# Patient Record
Sex: Female | Born: 1975 | Race: White | Hispanic: Yes | Smoking: Never smoker
Health system: Southern US, Community
[De-identification: ages and names within clinical notes are randomized; demographics above are authoritative.]

## PROBLEM LIST (undated history)

## (undated) DIAGNOSIS — T7840XA Allergy, unspecified, initial encounter: Secondary | ICD-10-CM

## (undated) DIAGNOSIS — C801 Malignant (primary) neoplasm, unspecified: Secondary | ICD-10-CM

## (undated) HISTORY — DX: Allergy, unspecified, initial encounter: T78.40XA

## (undated) HISTORY — PX: MOLE REMOVAL: SHX2046

## (undated) MED FILL — Fosaprepitant Dimeglumine For IV Infusion 150 MG (Base Eq): INTRAVENOUS | Qty: 5 | Status: AC

---

## 1999-01-03 ENCOUNTER — Other Ambulatory Visit: Admission: RE | Admit: 1999-01-03 | Discharge: 1999-01-03 | Payer: Self-pay | Admitting: *Deleted

## 2000-01-22 ENCOUNTER — Other Ambulatory Visit: Admission: RE | Admit: 2000-01-22 | Discharge: 2000-01-22 | Payer: Self-pay | Admitting: Obstetrics & Gynecology

## 2001-04-02 ENCOUNTER — Other Ambulatory Visit: Admission: RE | Admit: 2001-04-02 | Discharge: 2001-04-02 | Payer: Self-pay | Admitting: Obstetrics & Gynecology

## 2002-04-22 ENCOUNTER — Other Ambulatory Visit: Admission: RE | Admit: 2002-04-22 | Discharge: 2002-04-22 | Payer: Self-pay | Admitting: Obstetrics & Gynecology

## 2003-05-09 ENCOUNTER — Other Ambulatory Visit: Admission: RE | Admit: 2003-05-09 | Discharge: 2003-05-09 | Payer: Self-pay | Admitting: Obstetrics & Gynecology

## 2007-05-27 ENCOUNTER — Ambulatory Visit: Payer: Self-pay | Admitting: Internal Medicine

## 2007-05-27 LAB — CONVERTED CEMR LAB
Protein, U semiquant: NEGATIVE
Specific Gravity, Urine: 1.01
Urobilinogen, UA: 0.2

## 2007-05-28 LAB — CONVERTED CEMR LAB
Albumin: 3.9 g/dL (ref 3.5–5.2)
Alkaline Phosphatase: 35 units/L — ABNORMAL LOW (ref 39–117)
BUN: 8 mg/dL (ref 6–23)
Basophils Absolute: 0 10*3/uL (ref 0.0–0.1)
Cholesterol: 181 mg/dL (ref 0–200)
GFR calc Af Amer: 126 mL/min
GFR calc non Af Amer: 104 mL/min
HDL: 82.4 mg/dL (ref >39.0)
Hemoglobin: 13.8 g/dL (ref 12.0–15.0)
LDL Cholesterol: 84 mg/dL (ref 0–99)
Lymphocytes Relative: 36.3 % (ref 12.0–46.0)
MCHC: 33.4 g/dL (ref 30.0–36.0)
Monocytes Absolute: 0.3 10*3/uL (ref 0.2–0.7)
Monocytes Relative: 5.4 % (ref 3.0–11.0)
Neutro Abs: 2.6 10*3/uL (ref 1.4–7.7)
Platelets: 145 10*3/uL — ABNORMAL LOW (ref 150–400)
Potassium: 4.7 meq/L (ref 3.5–5.1)
RDW: 12.4 % (ref 11.5–14.6)
Triglycerides: 74 mg/dL (ref 0–149)

## 2008-09-21 ENCOUNTER — Encounter (INDEPENDENT_AMBULATORY_CARE_PROVIDER_SITE_OTHER): Payer: Self-pay | Admitting: Obstetrics

## 2008-09-21 ENCOUNTER — Inpatient Hospital Stay (HOSPITAL_COMMUNITY): Admission: AD | Admit: 2008-09-21 | Discharge: 2008-09-24 | Payer: Self-pay | Admitting: Obstetrics & Gynecology

## 2008-09-26 ENCOUNTER — Encounter: Admission: RE | Admit: 2008-09-26 | Discharge: 2008-10-25 | Payer: Self-pay | Admitting: Obstetrics & Gynecology

## 2008-10-26 ENCOUNTER — Encounter: Admission: RE | Admit: 2008-10-26 | Discharge: 2008-11-25 | Payer: Self-pay | Admitting: Obstetrics & Gynecology

## 2008-11-26 ENCOUNTER — Encounter: Admission: RE | Admit: 2008-11-26 | Discharge: 2008-12-26 | Payer: Self-pay | Admitting: Obstetrics & Gynecology

## 2008-12-27 ENCOUNTER — Encounter: Admission: RE | Admit: 2008-12-27 | Discharge: 2009-01-26 | Payer: Self-pay | Admitting: Obstetrics & Gynecology

## 2009-01-27 ENCOUNTER — Encounter: Admission: RE | Admit: 2009-01-27 | Discharge: 2009-02-26 | Payer: Self-pay | Admitting: Obstetrics & Gynecology

## 2009-02-27 ENCOUNTER — Encounter: Admission: RE | Admit: 2009-02-27 | Discharge: 2009-03-28 | Payer: Self-pay | Admitting: Obstetrics & Gynecology

## 2009-03-29 ENCOUNTER — Encounter: Admission: RE | Admit: 2009-03-29 | Discharge: 2009-04-26 | Payer: Self-pay | Admitting: Obstetrics & Gynecology

## 2009-04-29 ENCOUNTER — Encounter: Admission: RE | Admit: 2009-04-29 | Discharge: 2009-05-29 | Payer: Self-pay | Admitting: Obstetrics & Gynecology

## 2009-05-30 ENCOUNTER — Encounter: Admission: RE | Admit: 2009-05-30 | Discharge: 2009-06-26 | Payer: Self-pay | Admitting: Obstetrics & Gynecology

## 2009-06-27 ENCOUNTER — Encounter: Admission: RE | Admit: 2009-06-27 | Discharge: 2009-07-27 | Payer: Self-pay | Admitting: Obstetrics & Gynecology

## 2009-07-13 ENCOUNTER — Ambulatory Visit: Payer: Self-pay | Admitting: Internal Medicine

## 2009-07-28 ENCOUNTER — Encounter: Admission: RE | Admit: 2009-07-28 | Discharge: 2009-08-27 | Payer: Self-pay | Admitting: Obstetrics & Gynecology

## 2009-08-28 ENCOUNTER — Encounter: Admission: RE | Admit: 2009-08-28 | Discharge: 2009-09-27 | Payer: Self-pay | Admitting: Obstetrics & Gynecology

## 2009-09-14 ENCOUNTER — Telehealth (INDEPENDENT_AMBULATORY_CARE_PROVIDER_SITE_OTHER): Payer: Self-pay | Admitting: *Deleted

## 2009-09-28 ENCOUNTER — Encounter: Admission: RE | Admit: 2009-09-28 | Discharge: 2009-10-28 | Payer: Self-pay | Admitting: Obstetrics & Gynecology

## 2009-10-04 ENCOUNTER — Ambulatory Visit: Payer: Self-pay | Admitting: Internal Medicine

## 2009-10-29 ENCOUNTER — Encounter: Admission: RE | Admit: 2009-10-29 | Discharge: 2009-11-28 | Payer: Self-pay | Admitting: Obstetrics & Gynecology

## 2010-03-15 ENCOUNTER — Ambulatory Visit: Payer: Self-pay | Admitting: Internal Medicine

## 2010-03-15 LAB — CONVERTED CEMR LAB
AST: 16 units/L (ref 0–37)
BUN: 14 mg/dL (ref 6–23)
Basophils Absolute: 0 10*3/uL (ref 0.0–0.1)
Bilirubin Urine: NEGATIVE
Blood in Urine, dipstick: NEGATIVE
Cholesterol: 185 mg/dL (ref 0–200)
Eosinophils Absolute: 0.1 10*3/uL (ref 0.0–0.7)
GFR calc non Af Amer: 91.55 mL/min (ref >60)
Glucose, Bld: 79 mg/dL (ref 70–99)
Glucose, Urine, Semiquant: NEGATIVE
HCT: 40.8 % (ref 36.0–46.0)
HDL: 60.6 mg/dL (ref >39.00)
Ketones, urine, test strip: NEGATIVE
Lymphs Abs: 1.4 10*3/uL (ref 0.7–4.0)
Monocytes Absolute: 0.3 10*3/uL (ref 0.1–1.0)
Monocytes Relative: 5.5 % (ref 3.0–12.0)
Platelets: 162 10*3/uL (ref 150.0–400.0)
Potassium: 4 meq/L (ref 3.5–5.1)
Protein, U semiquant: NEGATIVE
RDW: 13.6 % (ref 11.5–14.6)
TSH: 1.15 microintl units/mL (ref 0.35–5.50)
Total Bilirubin: 0.9 mg/dL (ref 0.3–1.2)
VLDL: 13 mg/dL (ref 0.0–40.0)
pH: 5

## 2010-03-28 LAB — CONVERTED CEMR LAB: Pap Smear: NORMAL

## 2010-04-08 ENCOUNTER — Ambulatory Visit: Payer: Self-pay | Admitting: Internal Medicine

## 2010-05-28 NOTE — Assessment & Plan Note (Signed)
Summary: SINUS & EAR/PS   Vital Signs:  Patient profile:   35 year old female Weight:      114 pounds Temp:     98 degrees F oral Pulse rhythm:   regular Resp:     12 per minute BP sitting:   102 / 66  Vitals Entered By: Lynann Beaver CMA (July 13, 2009 2:08 PM) CC: sore throat, congestion and dry cough.  No fever Is Patient Diabetic? No Pain Assessment Patient in pain? no        History of Present Illness: [name]  comes in today for   acute illness evaluation.  CO of signs for 3-4 days  with sore throat achiness and congestion and when blew ears and popped and then got dizzy.   No fever.  Very hoarse.   No cough or sob. Did take some sudafed and nyquil at some point.  non longer dizzy . no other  heuro symptom and no pain except ST currently.  No hearing loss and dizzines isbetter  . Is still nursing her infant who is in day care and just treated for OM.      Preventive Screening-Counseling & Management  Alcohol-Tobacco     Smoking Status: never     Pack years: socially only  Caffeine-Diet-Exercise     Does Patient Exercise: yes  Allergies: No Known Drug Allergies  Past History:  Past Medical History: Unremarkable Childbirth ? allergy  Review of Systems       The patient complains of hoarseness.  The patient denies anorexia, fever, weight loss, weight gain, vision loss, decreased hearing, chest pain, syncope, dyspnea on exertion, peripheral edema, prolonged cough, headaches, difficulty walking, abnormal bleeding, and enlarged lymph nodes.    Physical Exam  General:  alert, well-developed, well-nourished, and well-hydrated.  hoarse in nad  Head:  normocephalic and atraumatic.   Eyes:  vision grossly intact, pupils equal, and pupils round.   Ears:  R ear normal, L ear normal, and no external deformities.   Nose:  no external deformity and no external erythema.  congested  no face pain  Mouth:  mild erythema  no lesions  cobblestoning  Neck:  No  deformities, masses, or tenderness noted. Lungs:  Normal respiratory effort, chest expands symmetrically. Lungs are clear to auscultation, no crackles or wheezes. Heart:  normal rate, regular rhythm, and no murmur.   Pulses:  nl cap refill  Neurologic:  alert & oriented X3, strength normal in all extremities, and gait normal.  no n focal grossly  Cervical Nodes:  tender ac not enlarged no posterior cervical adenopathy.   Psych:  Oriented X3, good eye contact, not anxious appearing, and not depressed appearing.     Impression & Recommendations:  Problem # 1:  URI (ICD-465.9) with eustachan tube dysfunction  causing symptom .Marland Kitchen no evidence of inner ear or bacterial infection at present.  limited  orals cause of nursing infant    Problem # 2:  LARYNGITIS-ACUTE (ICD-464.00) as above   Complete Medication List: 1)  Yaz 3-0.02 Mg Tabs (Drospirenone-ethinyl estradiol) .... Once daily 2)  Sudafed 12 Hour 120 Mg Xr12h-tab (Pseudoephedrine hcl) .... As directed 3)  Pre-natal Formula Tabs (Prenatal multivit-min-fe-fa) .... One by mouth daily  Patient Instructions: 1)  I think this is a viral respiratory  infection that may last 10 days but should feel better after another  4-5 days.  2)  Nasal decongestants  ok for no more than 3 days in a row  to help with th sinus and ear congestion. Saline  nose spray can help nose irritation.   3)  Claritin  is ok if needed.  4)  call if fever or face pain   or recurrent dizziness .  5)  Acute sinusitis symptoms for less than 10 days are not helped by antibiotics. Use warm moist compresses, and over the counter decongestants( only as directed). Call if no improvement in 5-7 days, sooner if increasing pain, fever, or new symptoms.

## 2010-05-28 NOTE — Assessment & Plan Note (Signed)
Summary: poison ivy/dm   Vital Signs:  Patient profile:   35 year old female Height:      69 inches Weight:      124 pounds BMI:     18.38 Temp:     98.1 degrees F oral Pulse rate:   88 / minute BP sitting:   110 / 70  (left arm) Cuff size:   regular  Vitals Entered By: Romualdo Bolk, CMA (AAMA) (October 04, 2009 10:23 AM) CC: Poison Ivy on Rt arm only. Started with one dot 2 weeks ago.   History of Present Illness: [name] comes in today  for SDA for a week of itchy rash  after exposure to PI pulling vinse  began as a dot 2 weeks ago and blossomed this week and spreading .   very itvchy and used otc hcs and urisol prep. no help . scalp itches at base from dry scalp but no rash there.  no one else in family  effected.   Preventive Screening-Counseling & Management  Alcohol-Tobacco     Smoking Status: never     Pack years: socially only  Caffeine-Diet-Exercise     Does Patient Exercise: yes  Current Medications (verified): 1)  Yaz 3-0.02 Mg  Tabs (Drospirenone-Ethinyl Estradiol) .... Once Daily 2)  Pre-Natal Formula  Tabs (Prenatal Multivit-Min-Fe-Fa) .... One By Mouth Daily  Allergies (verified): No Known Drug Allergies  Past History:  Past medical, surgical, family and social histories (including risk factors) reviewed for relevance to current acute and chronic problems.  Past Medical History: Reviewed history from 07/13/2009 and no changes required. Unremarkable Childbirth ? allergy  Past Surgical History: Reviewed history from 05/27/2007 and no changes required. mole removal from back, was ok  Family History: Reviewed history from 05/27/2007 and no changes required. father and mother A&W  Social History: Reviewed history from 05/27/2007 and no changes required. 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   Review of Systems  The patient denies anorexia,  fever, enlarged lymph nodes, and angioedema.    Physical Exam  General:  Well-developed,well-nourished,in no acute distress; alert,appropriate and cooperative throughout examination Head:  normocephalic and atraumatic.   Skin:  right arm with  linear contact dermatitis  blebs  upper and lower     none on scalp  where co of itching  Cervical Nodes:  No lymphadenopathy noted   Impression & Recommendations:  Problem # 1:  CONTACT DERMATITIS, RHUS (ICD-692.6) counseled     rx as directed and prevention Her updated medication list for this problem includes:    Prednisone 20 Mg Tabs (Prednisone) .Marland Kitchen... Take 3 a day x 3 days then 2 a day x3 days then 1/day x 3 days, then 1/2 a day x 3 days or as directed.    Fluocinonide 0.05 % Crea (Fluocinonide) .Marland Kitchen... Apply to poison ivy rash two times a day  not on face  Complete Medication List: 1)  Yaz 3-0.02 Mg Tabs (Drospirenone-ethinyl estradiol) .... Once daily 2)  Pre-natal Formula Tabs (Prenatal multivit-min-fe-fa) .... One by mouth daily 3)  Prednisone 20 Mg Tabs (Prednisone) .... Take 3 a day x 3 days then 2 a day x3 days then 1/day x 3 days, then 1/2 a day x 3 days or as directed. 4)  Fluocinonide 0.05 % Crea (Fluocinonide) .... Apply to poison ivy rash two times a day  not on face  Patient Instructions: 1)  prednisone   taper can  taper every 2 day sif responding quickly 2)  topical is more effective  if used early. 3)  avoidance in future  Prescriptions: FLUOCINONIDE 0.05 % CREA (FLUOCINONIDE) apply to poison ivy rash two times a day  not on face  #30 gm x 2   Entered and Authorized by:   Madelin Headings MD   Signed by:   Madelin Headings MD on 10/04/2009   Method used:   Electronically to        [facility] [phone]* (retail)       [address]       [place], [place]  [address]       Ph: [phone] or [phone]       Fax: [phone]   RxID:   [id number] PREDNISONE 20 MG TABS (PREDNISONE) Take 3 a day x 3 days then 2 a day x3 days  then 1/day x 3 days, then 1/2 a day x 3 days or as directed.  #30 x 0   Entered and Authorized by:   Madelin Headings MD   Signed by:   Madelin Headings MD on 10/04/2009   Method used:   Electronically to        [facility] [phone][facility] (retail)       [address]       [place], [place]  [address]       Ph: [phone] or [phone]       Fax: [phone]   RxID:   [id number]

## 2010-05-28 NOTE — Progress Notes (Signed)
Summary: Call-A-Nurse Report    Call-A-Nurse Triage Call Report Triage Record Num: [id number] Operator: Dayton Martes Patient Name: [name] Call Date & Time: 09/13/2009 7:42:36PM Patient Phone: PCP: Neta Mends. Panosh Patient Gender: Female PCP Fax : [phone] Patient DOB: 09-04-1975 Practice Name: [facility] Reason for Call: Pt sts that she has a hard, red area on the R breast and she is achy. Denies 911 sx's. Care adv given. Denies urgent emergent sx's. Care adv given. [facility]ACY[facility] @ [phone]. PER ROUTINE STANDING ORDERS-Dicloxacillin 500mg  PO Q 6hrs x 10days; no refills. Protocol(s) Used: Breast Symptoms - Postpartum Recommended Outcome per Protocol: See Provider within 4 hours Reason for Outcome: New onset or increasing redness, tenderness, localized warmth or swelling Care Advice:  ~ Apply warm soaks to the affected breast(s) for 20 to 30 minutes 3 or 4 times a day.  ~ Call provider if symptoms worsen or new symptoms develop.  ~ List, or take, all current prescription(s), OTC or alternative medication(s) to provider for evaluation. If breastfeeding, be sure to continue. Make sure to empty breast with each feeding. If unable to empty breast with nursing, express milk manually or with pump until breast empty.  ~ Drink 6-10 eight ounce glasses (1.2-2.0 liters) of fluids per day unless previously instructed to restrict fluid intake for other medical reasons. Limit fluids that contain caffeine, sugar or alcohol.  ~ Consider acetaminophen as directed on label or by pharmacist/provider for pain or fever. PRECAUTIONS: - Use only If there is no history of liver disease, alcoholism, or intake of three or more alcohol drinks per day. - If approved by provider when breastfeeding. - Do not exceed recommended dose or frequency.  ~  ~ SYMPTOM / CONDITION MANAGEMENT  ~ CAUTIONS 09/13/2009 7:56:56PM Page 1 of 1 [email or web address]   Left message to call back. Just checking  to see how she is doing. Romualdo Bolk, CMA [facility])  Sep 14, 2009 5:08 PM  Left message to call back to see how she is doing. Romualdo Bolk, CMA (AAMA)  Sep 18, 2009 9:36 AM  Appended Document: Call-A-Nurse Report Left message to call back with how she's doing.  Appended Document: Call-A-Nurse Report Pt is doing fine. Will call back if she has anymore problems.

## 2010-05-30 NOTE — Assessment & Plan Note (Signed)
Summary: cpx/no pap/njr----PT [facility] // RS   Vital Signs:  Patient profile:   35 year old female Height:      68.75 inches Weight:      123 pounds Temp:     98.7 degrees F oral Pulse rate:   72 / minute Pulse rhythm:   regular BP sitting:   110 / 74  (left arm) Cuff size:   regular  Vitals Entered By: Alfred Levins, CMA (April 08, 2010 10:17 AM) CC: cpx, no pap   CC:  cpx and no pap.  Current Medications (verified): 1)  Yaz 3-0.02 Mg  Tabs (Drospirenone-Ethinyl Estradiol) .... 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  on child---healthy  Physical Exam  General:  well-developed well-nourished female in no acute distress. She is quite thin. HEENT exam atraumatic, normocephalic, estrogen muscles are intact. Neck is supple without jugular venous distention. Chest clear auscultation cardiac exam S1-S2 are regular. Abdominal exam thin, to bowel sounds, soft and nontender. Extremities there is no clubbing cyanosis or edema.   Impression & Recommendations:  Problem # 1:  PREVENTIVE HEALTH CARE (ICD-V70.0) health maint UTD  continue exercise- increase calories.   Complete Medication List: 1)  Yaz 3-0.02 Mg Tabs (Drospirenone-ethinyl estradiol) .... Once daily  Preventive Care Screening  Pap Smear:    Date:  03/28/2010    Next Due:  03/2011    Results:  normal     Orders Added: 1)  Est. Patient 18-39 years [99395]

## 2010-06-29 ENCOUNTER — Ambulatory Visit (INDEPENDENT_AMBULATORY_CARE_PROVIDER_SITE_OTHER): Payer: BC Managed Care – PPO | Admitting: Family Medicine

## 2010-06-29 ENCOUNTER — Encounter: Payer: Self-pay | Admitting: Family Medicine

## 2010-06-29 DIAGNOSIS — J029 Acute pharyngitis, unspecified: Secondary | ICD-10-CM | POA: Insufficient documentation

## 2010-07-01 ENCOUNTER — Telehealth: Payer: Self-pay | Admitting: *Deleted

## 2010-07-01 NOTE — Telephone Encounter (Signed)
Needs labs from Saturday clinic, and wants to talk to Dr Cato Mulligan or his nurse about hand, foot and mouth disease that her daughter has, and she is sure she has this.

## 2010-07-02 ENCOUNTER — Encounter: Payer: Self-pay | Admitting: Internal Medicine

## 2010-07-02 ENCOUNTER — Telehealth: Payer: Self-pay | Admitting: *Deleted

## 2010-07-02 ENCOUNTER — Ambulatory Visit (INDEPENDENT_AMBULATORY_CARE_PROVIDER_SITE_OTHER): Payer: BC Managed Care – PPO | Admitting: Internal Medicine

## 2010-07-02 VITALS — BP 102/70 | HR 76 | Temp 98.6°F | Ht 69.0 in | Wt 122.0 lb

## 2010-07-02 DIAGNOSIS — B9789 Other viral agents as the cause of diseases classified elsewhere: Secondary | ICD-10-CM

## 2010-07-02 DIAGNOSIS — B084 Enteroviral vesicular stomatitis with exanthem: Secondary | ICD-10-CM

## 2010-07-02 NOTE — Telephone Encounter (Signed)
Pt made appt today.

## 2010-07-02 NOTE — Telephone Encounter (Signed)
Call-A-Nurse Triage Call Report Triage Record Num: [id number] Operator: Migdalia Dk Patient Name: [name] Call Date & Time: 07/02/2010 2:18:13AM Patient Phone: [phone] PCP: Valetta Mole. Swords Patient Gender: Female PCP Fax : [phone] Patient DOB: 12-Apr-1976 Practice Name: [facility] Reason for Call: Pt. states child just over Hand/Foot/Mouth and this a.m. she woke with blisters on fingers and feet. States feet burn and are swollen, achy. Afebrile at this time. Pt. states she did call office on 07/01/2010 x 3 and received no callback. Homecare advice given and pt. to call office in a.m. for appt. Hand, Foot, Mouth Disease, Diagnosed or Suspected Exposure Protocol(s) Used: Recommended Outcome per Protocol: See Provider within 24 hours Reason for Outcome: New signs and symptoms of local infection Care Advice: ~ Call provider if symptoms worsen or new symptoms develop. Analgesic/Antipyretic Advice - Acetaminophen: Consider acetaminophen as directed on label or by pharmacist/provider for pain or fever PRECAUTIONS: - Use if there is no history of liver disease, alcoholism, or intake of three or more alcohol drinks per day - Only if approved by provider during pregnancy or when breastfeeding - During pregnancy, acetaminophen should not be taken more than 3 consecutive days without telling provider - Do not exceed recommended dose or frequency ~ Apply local moist heat (such as a warm, wet wash cloth covered with plastic wrap) to the area for 15-20 minutes every 2-3 hours while awake. ~ 07/02/2010 2:23:58AM Page 1 of 1 CAN_TriageR

## 2010-07-02 NOTE — Progress Notes (Signed)
  Subjective:    Patient ID: [name], female    DOB: 1975/09/05, 35 y.o.   MRN: [id number]  HPI 3 days ago had acute illness with fever, chills, sweats. sxs were acute.   she reports an interesting contact with her daughter. Her daughter apparently has hand-foot-and-mouth syndrome.   Patient's fever was up to 102. She also had a significant sore throat. His rapid strep was negative. Since that time she has noted painful, burning vesicles on her hands. She has the same sensation on her feet but without any specific vesicles. She still has a mild sore throat. Fevers are somewhat better last documented fever was 101 yesterday. She is otherwise healthy.  Past Medical History  Diagnosis Date  . Allergy    Past Surgical History  Procedure Date  . Mole removal     reports that she has never smoked. She does not have any smokeless tobacco history on file. She reports that she drinks alcohol. She reports that she does not use illicit drugs. family history is not on file.    No Known Allergies    Review of Systems   patient denies chest pain, shortness of breath, orthopnea. Denies lower extremity edema, abdominal pain, change in appetite, change in bowel movements. Patient denies rashes, musculoskeletal complaints. No other specific complaints in a complete review of systems.       Objective:   Physical Exam  Well-developed, thin female in no acute distress. HEENT exam atraumatic, normocephalic, neck supple. Posterior oropharynx, soft palate with 2 abscess appearing ulcers. Tonsils are normal. No exudate. Neck without lymphadenopathy. Chest clear to auscultation cardiac exam S1 and S2 are regular. Dermatologic exam she has erythematous vesicles in the intertriginous regions of her fingers. These are somewhat tender to touch.       Assessment & Plan:   she has a viral illness consistent with  Hand foot and mouth syndrome. This is an unlikely diagnosis in adults but as possible.  She certainly has vesicles on her hands. She is consistent findings on her mouth and describes a viral type illness. This was discussed with her in detail. The diagnosis is even more likely given the diagnosis of her daughter. No specific treatment necessary. I've advised her not to work for 24 hours after her last documented fever. She will call with any concerns.

## 2010-07-04 NOTE — Assessment & Plan Note (Signed)
Summary: Fever, body aches, nausea, headache   Vital Signs:  Patient profile:   35 year old female Height:      68.75 inches Weight:      123 pounds BMI:     18.36 O2 Sat:      97 % on Room air Temp:     99.9 degrees F oral Pulse rate:   119 / minute BP sitting:   112 / 58  (left arm) Cuff size:   regular  Vitals Entered By: Payton Spark CMA (June 29, 2010 9:28 AM)  O2 Flow:  Room air CC: ST, bodyaches, fever, hot/cold flashes, HA x 2 days   Primary Care Provider:  Birdie Sons MD  CC:  ST, bodyaches, fever, hot/cold flashes, and HA x 2 days.  History of Present Illness: ST, bodyaches, fever, hot/cold flashes, HA x 2 days.  Started last evening.  Started with chllls and then achy and then nauseated.  Daughte has hand, foot, mouth right now.  Then had a severe HA and pressure behind her eyes.   Fever to 102.  Lat night took tylenol, nothing this Am.  Really thirsty this AM.  Started dry heaving this AM.  No urinary sxs.   Current Medications (verified): 1)  Yaz 3-0.02 Mg  Tabs (Drospirenone-Ethinyl Estradiol) .... Once Daily  Allergies (verified): No Known Drug Allergies  Physical Exam  General:  Well-developed,well-nourished,in no acute distress; alert,appropriate and cooperative throughout examination Head:  Normocephalic and atraumatic without obvious abnormalities. No apparent alopecia or balding. Eyes:  No corneal or conjunctival inflammation noted. EOMI. Perrla.  Ears:  External ear exam shows no significant lesions or deformities.  Otoscopic examination reveals clear canals, tympanic membranes are intact bilaterally without bulging, retraction, inflammation or discharge. Hearing is grossly normal bilaterally. Nose:  External nasal examination shows no deformity or inflammation. Mouth:  Oral mucosa and oropharynx without lesions or exudates.  OP is erythematous.  Neck:  No deformities, masses, or tenderness noted. Lungs:  Normal respiratory effort, chest expands  symmetrically. Lungs are clear to auscultation, no crackles or wheezes. Heart:  Normal rate and regular rhythm. S1 and S2 normal without gallop, murmur, click, rub or other extra sounds. Pulses:  Radial 2+  Skin:  no rashes.   Cervical Nodes:  Mild anterior cerv LN that is tender on exam.  Psych:  Cognition and judgment appear intact. Alert and cooperative with normal attention span and concentration. No apparent delusions, illusions, hallucinations   Impression & Recommendations:  Problem # 1:  PHARYNGITIS (ICD-462)  Her sxs sound like the flu but she has not cough or runny nose. She does have a ST that is erythematous. Rapid strep is neg. Will send a culture to confirm.  She reports fever to 102 at home whcih is high for strep throt in an adult so will check CBC as well.   Orders: Rapid Strep (16109) T-Culture, Throat ([id number]) T-CBC w/Diff ([id number])  Complete Medication List: 1)  Yaz 3-0.02 Mg Tabs (Drospirenone-ethinyl estradiol) .... Once daily  Patient Instructions: 1)  We will call you with with the strep culture results and blood test 2)  Motrin or Tylenol as needed for fever 3)  Stay hydrated with the fevers.    Orders Added: 1)  Rapid Strep [[id number]] 2)  T-Culture, Throat [[id number]] 3)  T-CBC w/Diff [[id number]]

## 2010-08-06 LAB — CBC
HCT: 29.6 % — ABNORMAL LOW (ref 36.0–46.0)
HCT: 30.2 % — ABNORMAL LOW (ref 36.0–46.0)
Hemoglobin: 10.7 g/dL — ABNORMAL LOW (ref 12.0–15.0)
Hemoglobin: 13.9 g/dL (ref 12.0–15.0)
MCHC: 35.8 g/dL (ref 30.0–36.0)
MCV: 94.7 fL (ref 78.0–100.0)
Platelets: 86 10*3/uL — ABNORMAL LOW (ref 150–400)
Platelets: 99 10*3/uL — ABNORMAL LOW (ref 150–400)
RBC: 4.16 MIL/uL (ref 3.87–5.11)
RDW: 13.4 % (ref 11.5–15.5)
RDW: 13.7 % (ref 11.5–15.5)
WBC: 7.4 10*3/uL (ref 4.0–10.5)
WBC: 7.7 10*3/uL (ref 4.0–10.5)
WBC: 8.4 10*3/uL (ref 4.0–10.5)

## 2010-08-06 LAB — COMPREHENSIVE METABOLIC PANEL
ALT: 19 U/L (ref 0–35)
ALT: 20 U/L (ref 0–35)
AST: 30 U/L (ref 0–37)
Albumin: 2.6 g/dL — ABNORMAL LOW (ref 3.5–5.2)
Alkaline Phosphatase: 148 U/L — ABNORMAL HIGH (ref 39–117)
Alkaline Phosphatase: 99 U/L (ref 39–117)
BUN: 7 mg/dL (ref 6–23)
CO2: 22 mEq/L (ref 19–32)
Chloride: 107 mEq/L (ref 96–112)
GFR calc non Af Amer: >60 mL/min (ref >60)
Glucose, Bld: 71 mg/dL (ref 70–99)
Glucose, Bld: 74 mg/dL (ref 70–99)
Potassium: 3.9 mEq/L (ref 3.5–5.1)
Potassium: 4 mEq/L (ref 3.5–5.1)
Sodium: 138 mEq/L (ref 135–145)
Sodium: 139 mEq/L (ref 135–145)
Total Bilirubin: 0.1 mg/dL — ABNORMAL LOW (ref 0.3–1.2)
Total Protein: 5.7 g/dL — ABNORMAL LOW (ref 6.0–8.3)

## 2010-08-06 LAB — LACTATE DEHYDROGENASE
LDH: 131 U/L (ref 94–250)
LDH: 190 U/L (ref 94–250)

## 2010-08-06 LAB — URIC ACID: Uric Acid, Serum: 5.5 mg/dL (ref 2.4–7.0)

## 2010-09-09 ENCOUNTER — Telehealth: Payer: Self-pay | Admitting: *Deleted

## 2010-09-09 NOTE — Telephone Encounter (Signed)
Romualdo Bolk 07/01/2010 8:57 AM Signed  [facility]  Triage Call Report  Triage Record Num: [id number] Operator: Alphonsa Overall  Patient Name: [name] Call Date & Time: 06/28/2010 6:33:09PM  Patient Phone: [phone] PCP: Valetta Mole. Swords  Patient Gender: Female PCP Fax : [phone]  Patient DOB: 1976-03-30 Practice Name: [facility]  Reason for Call:  LMP 2/12. Chad/ Spouse calling about flu. Onset 06/28/10 at 1600. Headache, muscle aches,  fever,cough. 101.9 oral at 1730. Ibuprofen taken at 1810. Pt did receive flu vaccine. Pt  otherwise healthy. Home care advice given.  Protocol(s) Used: Flu-Like Symptoms  Recommended Outcome per Protocol: Provide Home/Self Care  Reason for Outcome: Flu-like illness AND not in a high risk group  Care Advice:  ~ Use a cool mist humidifier to moisten air. Be sure to clean according to manufacturer's instructions.  ~ Rest until symptoms improve.  ~ Consider use of a saline nasal spray per package directions to help relieve nasal congestion.  ~ INFECTION CONTROL  Call provider immediately if develop a severe productive cough, fever over 101.5 F (38.6 C), and sharp pain when  coughing.  ~  Coughing up mucus or phlegm helps to get rid of an infection. A productive cough should not be stopped. A cough  medicine with guaifenesin (Robitussin, Mucinex) can help loosen the mucus. Cough medicine with dextromethorphan  (DM) should be avoided. Drinking lots of fluids can help loosen the mucus too, especially warm fluids.  ~  Most adults need to drink 6-10 eight-ounce glasses (1.2-2.0 liters) of fluids per day unless previously told to limit fluid  intake for other medical reasons. Limit fluids that contain caffeine, sugar or alcohol. Urine will be a very light yellow  color when you drink enough fluids.  ~  Antiviral medication can make the flu symptoms milder and shorten the time you have symptoms. Flu antiviral  medications are recommended  for individuals who have a greater chance of serious flu complications or who are very  sick with the flu. They work best if they are started within 2 days of getting sick.  ~  Analgesic/Antipyretic Advice - NSAIDs:  Consider aspirin, ibuprofen, naproxen or ketoprofen for pain or fever as directed on label or by pharmacist/provider.  PRECAUTIONS:  - If over 16 years of age, should not take longer than 1 week without consulting provider.  EXCEPTIONS:  - Should not be used if taking blood thinners or have bleeding problems.  - Do not use if have history of sensitivity/allergy to any of these medications; or history of cardiovascular, ulcer,  kidney, liver disease or diabetes unless approved by provider.  - Do not exceed recommended dose or frequency.  ~  See a provider immediately or go to the Emergency Department if having chest pain with breathing, change in level of  consciousness, new seizure, vomiting and unable to keep fluids down for 8 hours or more, or has not urinated for 8 or  more hours.  ~  Influenza - Expected Course:  - Symptoms start to improve in 3 to 7 days.  - Cough and feeling tired (malaise) may continue for several weeks.  - Cough and extreme fatigue lasting more than 3 weeks needs medical evaluation.  - Remain at home at least 24 hours after being free of fever 100F (37.8C) without the use of fever reducing  medication.  ~  Influenza - Transmission:  - Flu virus is spread through the air in droplets when a flu-infected  person coughs, sneezes or talks and another person  breathes in the droplets, or by touching a surface like a door knob, telephone, or keyboard that has been contaminated  ~  06/28/2010 6:52:42PM Page 1 of 2 [email or web address]  [facility]  Triage Call Report  Patient Name: [name] continuation page/s  by the droplets and then touching the mouth, nose, or eyes.  - An individual may be passing on the flu before they have any symptoms.  -  An individual may continue to be contagious with the flu for up to 7 days after the symptoms start. H1N1 influenza  may continue to be contagious as long as cough persists.  Influenza Respiratory Hygiene:  Cover the nose/mouth tightly with a tissue when coughing or sneezing.  - Use tissue one time and discard in the nearest waste receptacle.  - Wash hands with soap and water or alcohol-based hand rub for at least 15 seconds after coming into contact with  respiratory secretions and contaminated objects/materials.  - When a tissue is not available, cough into the bend of the elbow.  - Avoid touching your eyes, nose or mouth.  - When ill wear a disposable face mask when around others, especially around those at high risk for flu-related  complications, to help decrease the likelihood of infecting them.  ~

## 2010-09-10 NOTE — Discharge Summary (Signed)
NAMEJIMMI, [name]          ACCOUNT NO.:  [id number]   MEDICAL RECORD NO.:  [id number]          PATIENT TYPE:  INP   LOCATION:  9117                          FACILITY:  [facility]   PHYSICIAN:  Lendon Colonel, MD   DATE OF BIRTH:  06/05/1975   DATE OF ADMISSION:  09/21/2008  DATE OF DISCHARGE:  09/24/2008                               DISCHARGE SUMMARY   CHIEF COMPLAINT:  Labor.   HISTORY OF PRESENT ILLNESS:  A 35 year old, G1 presenting at 38 weeks  and 3 days gestation with labor with rupture of membranes.  Her prenatal  care history as well as her past medical, surgical, and obstetrical  histories can be found in her OB flow sheet.  On admission, the patient  was found to be 8 cm dilated, 100% effaced with ruptured membranes in  the frank breech position.  Heart tracing was reactive and the patient  was contracting every 2-4 minutes.  Bedside sonogram was done and  consistent with breech presentation.   Plan was for a urgent primary low transverse cesarean section that  operative note can be found in a separate dictation in short.  A 5 pound  14 ounce female with complete breech presentation was noted.  Normal  uterus, tubes, and ovaries were noted.  Two grams of Ancef were given.  Postoperatively, the patient had her pain well controlled with Percocet.  She was ambulating and voiding.  She did have some thrombocytopenia on  postop day #1 with platelets of 86.  These rebound and slightly prior to  discharge up to 92.  On postop day #2, the patient did have some  headaches given the thrombocytopenia with a headaches.  PIH labs were  obtained, however, her blood pressures were within normal limits.  His  labs returned as normal and by postop day #3, her headache was improved.  She was ambulating, voiding, her pain was well controlled.  She was  passing gas, eating.  Her vitals were stable.  Her incision was intact  and the patient was discharged to home.   DISCHARGE DIAGNOSIS:   Status post primary low transverse cesarean  section for breech presentation.   DISCHARGE DISPOSITION:  To home.   DISCHARGE CONDITION:  Stable.   Discharge medications include Motrin, Percocet, Colace.  The patient was  asked to followup in the office in 6 weeks.      Lendon Colonel, MD  Electronically Signed     KAF/MEDQ  D:  10/11/2008  T:  10/12/2008  Job:  [id number]

## 2010-09-10 NOTE — Op Note (Signed)
[name], [name]          ACCOUNT NO.:  [id number]   MEDICAL RECORD NO.:  [id number]          PATIENT TYPE:  INP   LOCATION:  9198                          FACILITY:  [facility]   PHYSICIAN:  Lendon Colonel, MD   DATE OF BIRTH:  1975-07-11   DATE OF PROCEDURE:  09/21/2008  DATE OF DISCHARGE:                               OPERATIVE REPORT   PREOPERATIVE DIAGNOSIS:  Breech in labor.   POSTOPERATIVE DIAGNOSIS:  Breech in labor.   PROCEDURE:  Primary low transverse cesarean section.   ANESTHESIA:  Spinal.   SURGEON:  Lendon Colonel, MD   ASSISTANT:  Marlinda Mike, CNM   ANTIBIOTICS:  Ancef 2 g.   SPECIMENS:  Placenta to Pathology for SGA.   FINDINGS:  Female infant, Apgars 8 and 9, weight 5 pounds, 14 ounces in  the frank breech position.  Normal uterus, tubes, and ovaries, ruptured  membranes.   ESTIMATED BLOOD LOSS:  Unknown at the time of the dictation.   COMPLICATIONS:  None.   INDICATIONS:  This is a primi at 38+ weeks, who presents ruptured with  the cervical dilation of 8 cm.  Frank breech was confirmed by  ultrasound.  The patient was consented for primary low transverse  cesarean section and was taken to the OR.   PROCEDURE:  After informed consent was obtained, the patient was taken  to the operating room where spinal anesthesia was initiated without  difficulty.  Ancef 2 g were administered.  She was prepped and draped in  the normal sterile fashion in the dorsal supine position with the  leftward tilt.  A Foley catheter was inserted sterilely into the  bladder.  Pfannenstiel skin incision was made 2 cm above the pubic  symphysis in the midline with the scalpel carried through to the  underlying layer of fascia with the Bovie cautery.  Fascia was incised  in the midline and the incision extended laterally with the Mayo  scissors.  The fascial incision was grasped with Kocher clamps, elevated  up, and the underlying rectus muscles was dissected off sharply  to the  pubic symphysis.  Superior aspect of the fascial incision was grasped  with Kocher clamps, elevated up, and the underlying rectus muscles was  dissected off sharply with combination of the Bovie and blunt  dissection.  The rectus muscles were separated in the midline.  The  pyramidalis muscles were separated sharply with the Mayo scissors.  The  peritoneum was identified and entered bluntly.  Peritoneal incision was  extended superiorly and inferiorly with good visualization of the  bladder.  A bladder blade was inserted.  Vesicouterine peritoneum was  identified, grasped with pickups, and entered sharply with Metzenbaum  scissors.  The incision was extended laterally.  The bladder flap was  created digitally.  Bladder blade was reinserted.  The lower uterine  segment was incised in a transverse fashion with a scalpel and extended  bluntly.  Infant's sacrum was found to be in the RST.  Sacrum was  brought to the incision.  The right and left arm was swept across the  chest and head was  delivered in flexion.  Cord was clamped and cut.  The  infant was bulb suctioned and handed off to the awaiting pediatrician.  The placenta was removed manually after the cord was evulsed.  The  uterus was exteriorized, cleared of all clots, and debris.  The uterine  incision was repaired with 0 Monocryl in a running locked fashion.  A  second layer of O Vicryl was used in the imbricating fashion to obtain  good hemostasis.  Uterus was returned to the abdomen.  The incision was  inspected, found to be hemostatic.  The tagged edges were cut.  The  pelvis was irrigated with warm normal saline and the gutters were  cleared of all clots and debris.  Peritoneum was closed with 2-0 Vicryl  in a running fashion and the cut muscle edges and underside of the  fascia were inspected.  Some bleeding was noted just from the periosteum  of the pubic symphysis in the cut fascial edges at that point.  Cautery  was  carried out.  Good hemostasis was noted.  Care was taken to avoid  the bladder with any of this cautery and small piece of Gelfoam was  placed.  Excellent hemostasis was noted.  The fascia was closed with O  Vicryl in a running fashion in 2 half and the skin was closed with  staples.  The patient tolerated the procedure well.  Sponge, lap, and  needle count was correct x3, and the patient was taken to the recovery  room in a stable condition.      Lendon Colonel, MD  Electronically Signed     KAF/MEDQ  D:  09/21/2008  T:  09/21/2008  Job:  [id number]

## 2011-12-08 ENCOUNTER — Ambulatory Visit (INDEPENDENT_AMBULATORY_CARE_PROVIDER_SITE_OTHER): Payer: BC Managed Care – PPO | Admitting: Internal Medicine

## 2011-12-08 ENCOUNTER — Encounter: Payer: Self-pay | Admitting: Internal Medicine

## 2011-12-08 VITALS — BP 114/80 | Temp 103.0°F | Wt 120.0 lb

## 2011-12-08 DIAGNOSIS — R509 Fever, unspecified: Secondary | ICD-10-CM

## 2011-12-08 DIAGNOSIS — J029 Acute pharyngitis, unspecified: Secondary | ICD-10-CM

## 2011-12-08 MED ORDER — DOXYCYCLINE HYCLATE 100 MG PO TABS: 100.0000 mg | ORAL_TABLET | Freq: Two times a day (BID) | ORAL | Status: AC

## 2011-12-08 NOTE — Progress Notes (Signed)
Patient ID: [name], female   DOB: 1976/03/05, 36 y.o.   MRN: [id number]  Acute illness- Became sick Saturday night and fever, chills, aches, headache on Sunday morning. No tick exposure. She does admit to rash on chest last week.  Past Medical History  Diagnosis Date  . Allergy     History   Social History  . Marital Status: Married    Spouse Name: N/A    Number of Children: N/A  . Years of Education: N/A   Occupational History  . Not on file.   Social History Main Topics  . Smoking status: Never Smoker   . Smokeless tobacco: Not on file  . Alcohol Use: Yes  . Drug Use: No  . Sexually Active: Not on file   Other Topics Concern  . Not on file   Social History Narrative  . No narrative on file    Past Surgical History  Procedure Date  . Mole removal     No family history on file.  No Known Allergies  Current Outpatient Prescriptions on File Prior to Visit  Medication Sig Dispense Refill  . drospirenone-ethinyl estradiol (YAZ) 3-0.02 MG per tablet Take 1 tablet by mouth daily.           patient denies chest pain, shortness of breath, orthopnea. Denies lower extremity edema, abdominal pain, change in appetite, change in bowel movements. Patient denies rashes, musculoskeletal complaints. No other specific complaints in a complete review of systems.   BP 114/80  Temp 103 F (39.4 C) (Oral)  Wt 120 lb (54.432 kg)  Well-developed well-nourished female in no acute distress. HEENT exam atraumatic, normocephalic, extraocular muscles are intact. Neck is supple. No jugular venous distention no thyromegaly. Chest clear to auscultation without increased work of breathing. Cardiac exam S1 and S2 are regular. Abdominal exam active bowel sounds, soft, nontender. Extremities no edema. Neurologic exam she is alert without any motor sensory deficits. Gait is normal.   A/p- acute febrile illness  strep negative Doxycycline for emperic treatment of rmsf

## 2011-12-11 ENCOUNTER — Telehealth: Payer: Self-pay | Admitting: Internal Medicine

## 2011-12-11 NOTE — Telephone Encounter (Signed)
Patient calling, was seen in the office on Monday for flu like sx.  The fever of 103 has resolved but her throat is worse.  Has severe pain.  She can swallow liquids.   Still has fatigue but no more body aches.  Not sleeping good.  Was placed on Doxycycline bid.  She has taken x 4 days.    LMP 8/5.  The glands in her neck are sore to touch and swollen.  This has developed since her office visit on 8/12.  Scheduled for 1045 Friday, 8/16 wtih Dr. Abran Cantor.

## 2011-12-12 ENCOUNTER — Encounter: Payer: Self-pay | Admitting: Family Medicine

## 2011-12-12 ENCOUNTER — Telehealth: Payer: Self-pay | Admitting: Family Medicine

## 2011-12-12 ENCOUNTER — Ambulatory Visit (INDEPENDENT_AMBULATORY_CARE_PROVIDER_SITE_OTHER): Payer: BC Managed Care – PPO | Admitting: Family Medicine

## 2011-12-12 VITALS — BP 98/74 | Temp 98.1°F | Wt 120.0 lb

## 2011-12-12 DIAGNOSIS — B349 Viral infection, unspecified: Secondary | ICD-10-CM

## 2011-12-12 DIAGNOSIS — B9789 Other viral agents as the cause of diseases classified elsewhere: Secondary | ICD-10-CM

## 2011-12-12 NOTE — Progress Notes (Signed)
  Subjective:    Patient ID: [name], female    DOB: 10-19-1975, 36 y.o.   MRN: [id number]  HPI Here to follow up on a viral illness. She was seen by Dr. Cato Mulligan on 11-28-11 for fevers as high as 103 degrees, HA, body aches, and a ST. A rapid strep test was negative. This was felt to be a viral illness but she was started on Doxycyline for 10 days as a precaution. Since then the fevers and the body aches have stopped, but she still has a bad ST. She has a dry cough, and her right eye has been red and irritated for 2 days. No NVD or rashes. Using Ibuprofen.    Review of Systems  Constitutional: Negative.   HENT: Positive for congestion, sore throat, rhinorrhea, voice change and postnasal drip. Negative for ear pain, neck pain and neck stiffness.   Eyes: Positive for redness and itching. Negative for discharge and visual disturbance.  Respiratory: Positive for cough.        Objective:   Physical Exam  Constitutional: She appears well-developed and well-nourished.  HENT:  Left Ear: External ear normal.  Nose: Nose normal.       Posterior OP is red without exudate   Eyes: Pupils are equal, round, and reactive to light. Right eye exhibits no discharge. Left eye exhibits no discharge.       The right conjunctiva is slightly red   Neck: Neck supple.       Positive AC nodes   Pulmonary/Chest: Effort normal and breath sounds normal.          Assessment & Plan:  This is consistent with an adenovirus infection. She will finish out the Doxycycline. Recheck prn

## 2011-12-12 NOTE — Telephone Encounter (Signed)
[facility] Triage Call Report Triage Record Num: [id number] Operator: Amy Head Patient Name: [name] Call Date & Time: 12/11/2011 7:47:49PM Patient Phone: [phone] PCP: Valetta Mole. Swords Patient Gender: Female PCP Fax : [phone] Patient DOB: [birth year] [facility] Name[facility] Reason for Call: Caller: Jackie/Patient; PCP: Birdie Sons; CB#: [phone]; Call regarding White spots in back of throat.; Spoke with nurse earlier and was asked if she had any white or yellow patches to back of throat. Did not at the time. Has white blister like areas all over throat now and tonsils are elarged. An appointment was made for patient earlier for tomorrow at 1045. All emergent symptoms per Sore Throat protocol ruled out. Home care advice given. Advised to keep appointment and call back for any worsening symptoms. Protocol(s) Used: Sore Throat or Hoarseness Recommended Outcome per Protocol: See Provider within 24 hours Reason for Outcome: Has enlarged tonsils covered by yellow-white patches Care Advice: ~ Use a cool mist humidifier to moisten air. Be sure to clean according to manufacturer's instructions. To help prevent the spread of infection, do not share eating or drinking utensils, personal care items like a toothbrush, or food. Wash hands often with soap and water or alcohol-based hand rub. ~ Avoid exposure to environmental irritants. Do not smoke and avoid second-hand smoke. Avoid outside activities on high pollution days. Instead of strong smelling commercial cleaning products, substitute vinegar and lemon juice. ~ ~ Stay home until your temperature returns to normal. Antibiotics may be prescribed by a provider after confirmation of strep throat or in a high risk situation, until a diagnosis is confirmed. Inform provider of any previous reaction or sensitivity to penicillin. Complete entire course of antibiotics, even when feeling better, if ordered by your  provider. ~ ~ HEALTH PROMOTION / MAINTENANCE ~ SYMPTOM / CONDITION MANAGEMENT ~ INFECTION CONTROL ~ CAUTIONS Most adults need to drink 6-10 eight-ounce glasses (1.2-2.0 liters) of fluids per day unless previously told to limit fluid intake for other medical reasons. Limit fluids that contain caffeine, sugar or alcohol. Urine will be a very light yellow color when you drink enough fluids. ~ Analgesic/Antipyretic Advice - Acetaminophen: Consider acetaminophen as directed on label or by pharmacist/provider for pain or fever PRECAUTIONS: - Use if there is no history of liver disease, alcoholism, or intake of three or more alcohol drinks per day - Only if approved by provider during pregnancy or when breastfeeding - During pregnancy, acetaminophen should not be taken more than 3 consecutive days without telling provider - Do not exceed recommended dose or frequency ~ ~ Call 911 if voice muffled, is unable to swallow own saliva and is drooling or choking sensation. Sore Throat Relief: - Use warm salt water gargles 3 to 4 times/day, as needed (1/2 tsp. salt in 8 oz. [.2 liters] water). - Suck on hard candy, nonprescription or herbal throat lozenges (sugar-free if diabetic) - Eat soothing, soft food/fluids (broths, soups, or honey and lemon juice in hot tea, Popsicles, frozen yogurt or sherbet, scrambled eggs, cooked cereals, Jell-O or puddings) whichever is most comforting. - Avoid eating salty, spicy or acidic foods. ~ 12/11/2011 7:56:54PM Page 1 of 2 CAN_TriageRpt_V2 [facility] Triage Call Report Patient Name: Ashby Leflore continuation page/s ~ Rest until symptoms improve. If more than [redacted] weeks pregnant, lie on left side when resting. To help relieve nasal congestion, use nonprescription saline nasal spray according to label instructions or as directed by a healthcare provider. ~ If pregnancy is known or suspected, get advice from  provider before using any nonprescription  medication other than acetaminophen ~ Analgesic/Antipyretic Advice - NSAIDs: Consider aspirin, ibuprofen, naproxen or ketoprofen for pain or fever as directed on label or by pharmacist/provider. PRECAUTIONS: - If over 61 years of age, should not take longer than 1 week without consulting provider. EXCEPTIONS: - Should not be used if taking blood thinners or have bleeding problems. - Do not use if have history of sensitivity/allergy to any of these medications; or history of cardiovascular, ulcer, kidney, liver disease or diabetes unless approved by provider. - Do not exceed recommended dose or frequency. ~ 08/15/

## 2013-03-22 ENCOUNTER — Telehealth: Payer: Self-pay | Admitting: Internal Medicine

## 2013-03-22 NOTE — Telephone Encounter (Signed)
Patient Information:  Caller Name: Annice Pih  Phone: [phone]  Patient: Penny, [name]  Gender: Female  DOB: 1975/08/07  Age: 37 Years  PCP: Birdie Sons (Adults only)  Pregnant: No  Office Follow Up:  Does the office need to follow up with this patient?: No  Instructions For The Office: N/A  RN Note:  Home care advice and call back parameters reviewed. She would like to try home care and call back as needed. Declined appt.   Symptoms  Reason For Call & Symptoms: Patient states a "nagging cough" for 2-3 weeks.  She states 'tickle in throat' and has coughing fits at time. No congestion, no runny nose. No cold s/sx.  "Dry cough  Reviewed Health History In EMR: Yes  Reviewed Medications In EMR: Yes  Reviewed Allergies In EMR: Yes  Reviewed Surgeries / Procedures: Yes  Date of Onset of Symptoms: 03/01/2013  Treatments Tried: Dayquil.  Treatments Tried Worked: No OB / GYN:  LMP: 02/22/2013  Guideline(s) Used:  Cough  Disposition Per Guideline:   See Within 3 Days in Office  Reason For Disposition Reached:   Cough has been present for > 10 days  Advice Given:  Reassurance  You can get a dry hacking cough after a chest cold. Sometimes this type of cough can last 1-3 weeks, and be worse at night.  You can also get a cough after being exposed to irritating substances like smoke, strong perfumes, and dust.  Here is some care advice that should help.  Cough Medicines:  OTC Cough Syrups: The most common cough suppressant in OTC cough medications is dextromethorphan. Often the letters "DM" appear in the name.  Home Remedy - Hard Candy: Hard candy works just as well as medicine-flavored OTC cough drops. Diabetics should use sugar-free candy.  Home Remedy - Honey: This old home remedy has been shown to help decrease coughing at night. The adult dosage is 2 teaspoons (10 ml) at bedtime. Honey should not be given to infants under one year of age.  OTC Cough Syrup - Dextromethorphan:  Cough syrups containing the cough suppressant dextromethorphan (DM) may help decrease your cough. Cough syrups work best for coughs that keep you awake at night. They can also sometimes help in the late stages of a respiratory infection when the cough is dry and hacking. They can be used along with cough drops.  Examples: Benylin, Robitussin DM, Vicks 44 Cough Relief  Read the package instructions for dosage, contraindications, and other important information.  Coughing Spasms:  Drink warm fluids. Inhale warm mist (Reason: both relax the airway and loosen up the phlegm).  Suck on cough drops or hard candy to coat the irritated throat.  Prevent Dehydration:  Drink adequate liquids.  This will help soothe an irritated or dry throat and loosen up the phlegm.  Call Back If:  Difficulty breathing  Cough lasts more than 3 weeks  Fever lasts > 3 days  You become worse.  Patient Will Follow Care Advice:  YES

## 2013-03-28 ENCOUNTER — Ambulatory Visit (INDEPENDENT_AMBULATORY_CARE_PROVIDER_SITE_OTHER): Payer: BC Managed Care – PPO | Admitting: Family Medicine

## 2013-03-28 VITALS — BP 120/64 | HR 78 | Temp 97.8°F | Resp 16 | Ht 69.25 in | Wt 128.0 lb

## 2013-03-28 DIAGNOSIS — R05 Cough: Secondary | ICD-10-CM

## 2013-03-28 DIAGNOSIS — R059 Cough, unspecified: Secondary | ICD-10-CM

## 2013-03-28 DIAGNOSIS — J45909 Unspecified asthma, uncomplicated: Secondary | ICD-10-CM

## 2013-03-28 DIAGNOSIS — J4520 Mild intermittent asthma, uncomplicated: Secondary | ICD-10-CM

## 2013-03-28 MED ORDER — ALBUTEROL SULFATE HFA 108 (90 BASE) MCG/ACT IN AERS: INHALATION_SPRAY | RESPIRATORY_TRACT | Status: DC

## 2013-03-28 MED ORDER — BENZONATATE 100 MG PO CAPS: 100.0000 mg | ORAL_CAPSULE | Freq: Three times a day (TID) | ORAL | Status: DC | PRN

## 2013-03-28 MED ORDER — PREDNISONE 20 MG PO TABS: ORAL_TABLET | ORAL | Status: DC

## 2013-03-28 MED ORDER — AZITHROMYCIN 250 MG PO TABS: ORAL_TABLET | ORAL | Status: DC

## 2013-03-28 NOTE — Progress Notes (Signed)
Subjective Patient's been having problems with a cough for about a month. She does not smoke. It started as a hacking cough and has waxed and waned but continues on. She coughs up a little bit of phlegm. She was wheezing this morning. She does not feel ill. However when she talks in a meeting she starts coughing.  Objective: Pleasant alert lady in no acute distress. Her TMs are normal. Throat clear. Neck supple without nodes thyromegaly. No sinus tenderness. Chest has mild end expiratory wheeze. Heart regular without murmurs.  Assessment: Asthmatic bronchitis  Plan: Prednisone Albuterol Tessalon Z-Pak Advised her to practice learned how to swallow pills

## 2013-03-28 NOTE — Patient Instructions (Addendum)
Drink plenty of fluids  Use the inhaler 2 inhalations 4 times daily as needed for coughing and wheezing  Take the antibiotic 2 pills initially then one daily for 4 days  Take the prednisone 3 pills daily for 2 days, then 2 daily for 2 days, then one daily for 2 days. Recommend taking them in the morning after breakfast.

## 2013-04-16 ENCOUNTER — Telehealth: Payer: Self-pay

## 2013-04-16 NOTE — Telephone Encounter (Signed)
Pt still having symptoms and would like to know what to do next   Best number [phone]

## 2013-04-18 NOTE — Telephone Encounter (Signed)
Called her to advise.  

## 2013-04-18 NOTE — Telephone Encounter (Signed)
Would advise pt to RTC for futher eval, may need to test pulmonary function for underlying asthma and/or do a cxr

## 2013-04-18 NOTE — Telephone Encounter (Signed)
Patient states she has a dry cough she can not get rid of it. No relief with the tessalon.

## 2014-08-01 ENCOUNTER — Telehealth: Payer: Self-pay | Admitting: Internal Medicine

## 2014-08-01 NOTE — Telephone Encounter (Signed)
Ok  We see her daughter

## 2014-08-01 NOTE — Telephone Encounter (Signed)
Pt will like to est with dr Fabian Sharppanosh old dr swords pt. Can I sch?

## 2014-08-02 NOTE — Telephone Encounter (Signed)
Pt has been sch

## 2014-11-22 ENCOUNTER — Ambulatory Visit (INDEPENDENT_AMBULATORY_CARE_PROVIDER_SITE_OTHER): Payer: BLUE CROSS/BLUE SHIELD | Admitting: Internal Medicine

## 2014-11-22 ENCOUNTER — Encounter: Payer: Self-pay | Admitting: Internal Medicine

## 2014-11-22 VITALS — BP 116/80 | Temp 98.2°F | Ht 69.0 in | Wt 128.4 lb

## 2014-11-22 DIAGNOSIS — Z0000 Encounter for general adult medical examination without abnormal findings: Secondary | ICD-10-CM | POA: Diagnosis not present

## 2014-11-22 LAB — CBC WITH DIFFERENTIAL/PLATELET
BASOS PCT: 0.4 % (ref 0.0–3.0)
Basophils Absolute: 0 10*3/uL (ref 0.0–0.1)
EOS PCT: 2.3 % (ref 0.0–5.0)
Eosinophils Absolute: 0.1 10*3/uL (ref 0.0–0.7)
HEMATOCRIT: 40.3 % (ref 36.0–46.0)
Hemoglobin: 13.7 g/dL (ref 12.0–15.0)
LYMPHS PCT: 29.5 % (ref 12.0–46.0)
Lymphs Abs: 1.9 10*3/uL (ref 0.7–4.0)
MCHC: 34.1 g/dL (ref 30.0–36.0)
MCV: 91.8 fl (ref 78.0–100.0)
Monocytes Absolute: 0.3 10*3/uL (ref 0.1–1.0)
Monocytes Relative: 4.1 % (ref 3.0–12.0)
NEUTROS ABS: 4 10*3/uL (ref 1.4–7.7)
NEUTROS PCT: 63.7 % (ref 43.0–77.0)
PLATELETS: 169 10*3/uL (ref 150.0–400.0)
RBC: 4.39 Mil/uL (ref 3.87–5.11)
RDW: 13.8 % (ref 11.5–15.5)
WBC: 6.3 10*3/uL (ref 4.0–10.5)

## 2014-11-22 LAB — BASIC METABOLIC PANEL
BUN: 12 mg/dL (ref 6–23)
CALCIUM: 9 mg/dL (ref 8.4–10.5)
CO2: 27 meq/L (ref 19–32)
CREATININE: 0.76 mg/dL (ref 0.40–1.20)
Chloride: 104 mEq/L (ref 96–112)
GFR: 90.49 mL/min (ref >60.00)
Glucose, Bld: 76 mg/dL (ref 70–99)
POTASSIUM: 3.5 meq/L (ref 3.5–5.1)
SODIUM: 138 meq/L (ref 135–145)

## 2014-11-22 LAB — LIPID PANEL
CHOLESTEROL: 151 mg/dL (ref 0–200)
HDL: 59.4 mg/dL (ref >39.00)
LDL CALC: 75 mg/dL (ref 0–99)
NONHDL: 91.6
Total CHOL/HDL Ratio: 3
Triglycerides: 85 mg/dL (ref 0.0–149.0)
VLDL: 17 mg/dL (ref 0.0–40.0)

## 2014-11-22 LAB — HEPATIC FUNCTION PANEL
ALBUMIN: 4.2 g/dL (ref 3.5–5.2)
ALT: 11 U/L (ref 0–35)
AST: 13 U/L (ref 0–37)
Alkaline Phosphatase: 41 U/L (ref 39–117)
BILIRUBIN DIRECT: 0.2 mg/dL (ref 0.0–0.3)
TOTAL PROTEIN: 7.4 g/dL (ref 6.0–8.3)
Total Bilirubin: 0.7 mg/dL (ref 0.2–1.2)

## 2014-11-22 LAB — TSH: TSH: 1.6 u[IU]/mL (ref 0.35–4.50)

## 2014-11-22 NOTE — Patient Instructions (Signed)
Will notify you  of labs when available. Healthy lifestyle includes : At least 150 minutes of exercise weeks  , weight at healthy levels, which is usually   BMI 19-25. Avoid trans fats and processed foods;  Increase fresh fruits and veges to 5 servings per day. And avoid sweet beverages including tea and juice. Mediterranean diet with olive oil and nuts have been noted to be heart and brain healthy . Avoid tobacco products . Limit  alcohol to  7 per week for women and 14 servings for men.  Get adequate sleep . Wear seat belts . Don't text and drive .   Preventive Care for Adults A healthy lifestyle and preventive care can promote health and wellness. Preventive health guidelines for women include the following key practices.  A routine yearly physical is a good way to check with your health care provider about your health and preventive screening. It is a chance to share any concerns and updates on your health and to receive a thorough exam.  Visit your dentist for a routine exam and preventive care every 6 months. Brush your teeth twice a day and floss once a day. Good oral hygiene prevents tooth decay and gum disease.  The frequency of eye exams is based on your age, health, family medical history, use of contact lenses, and other factors. Follow your health care provider's recommendations for frequency of eye exams.  Eat a healthy diet. Foods like vegetables, fruits, whole grains, low-fat dairy products, and lean protein foods contain the nutrients you need without too many calories. Decrease your intake of foods high in solid fats, added sugars, and salt. Eat the right amount of calories for you.Get information about a proper diet from your health care provider, if necessary.  Regular physical exercise is one of the most important things you can do for your health. Most adults should get at least 150 minutes of moderate-intensity exercise (any activity that increases your heart rate and  causes you to sweat) each week. In addition, most adults need muscle-strengthening exercises on 2 or more days a week.  Maintain a healthy weight. The body mass index (BMI) is a screening tool to identify possible weight problems. It provides an estimate of body fat based on height and weight. Your health care provider can find your BMI and can help you achieve or maintain a healthy weight.For adults 20 years and older:  A BMI below 18.5 is considered underweight.  A BMI of 18.5 to 24.9 is normal.  A BMI of 25 to 29.9 is considered overweight.  A BMI of 30 and above is considered obese.  Maintain normal blood lipids and cholesterol levels by exercising and minimizing your intake of saturated fat. Eat a balanced diet with plenty of fruit and vegetables. Blood tests for lipids and cholesterol should begin at age 39 and be repeated every 5 years. If your lipid or cholesterol levels are high, you are over 50, or you are at high risk for heart disease, you may need your cholesterol levels checked more frequently.Ongoing high lipid and cholesterol levels should be treated with medicines if diet and exercise are not working.  If you smoke, find out from your health care provider how to quit. If you do not use tobacco, do not start.  Lung cancer screening is recommended for adults aged 54-80 years who are at high risk for developing lung cancer because of a history of smoking. A yearly low-dose CT scan of the lungs is recommended  for people who have at least a 30-pack-year history of smoking and are a current smoker or have quit within the past 15 years. A pack year of smoking is smoking an average of 1 pack of cigarettes a day for 1 year (for example: 1 pack a day for 30 years or 2 packs a day for 15 years). Yearly screening should continue until the smoker has stopped smoking for at least 15 years. Yearly screening should be stopped for people who develop a health problem that would prevent them from  having lung cancer treatment.  If you are pregnant, do not drink alcohol. If you are breastfeeding, be very cautious about drinking alcohol. If you are not pregnant and choose to drink alcohol, do not have more than 1 drink per day. One drink is considered to be 12 ounces (355 mL) of beer, 5 ounces (148 mL) of wine, or 1.5 ounces (44 mL) of liquor.  Avoid use of street drugs. Do not share needles with anyone. Ask for help if you need support or instructions about stopping the use of drugs.  High blood pressure causes heart disease and increases the risk of stroke. Your blood pressure should be checked at least every 1 to 2 years. Ongoing high blood pressure should be treated with medicines if weight loss and exercise do not work.  If you are 33-48 years old, ask your health care provider if you should take aspirin to prevent strokes.  Diabetes screening involves taking a blood sample to check your fasting blood sugar level. This should be done once every 3 years, after age 71, if you are within normal weight and without risk factors for diabetes. Testing should be considered at a younger age or be carried out more frequently if you are overweight and have at least 1 risk factor for diabetes.  Breast cancer screening is essential preventive care for women. You should practice "breast self-awareness." This means understanding the normal appearance and feel of your breasts and may include breast self-examination. Any changes detected, no matter how small, should be reported to a health care provider. Women in their 30s and 30s should have a clinical breast exam (CBE) by a health care provider as part of a regular health exam every 1 to 3 years. After age 40, women should have a CBE every year. Starting at age 31, women should consider having a mammogram (breast X-ray test) every year. Women who have a family history of breast cancer should talk to their health care provider about genetic screening. Women at  a high risk of breast cancer should talk to their health care providers about having an MRI and a mammogram every year.  Breast cancer gene (BRCA)-related cancer risk assessment is recommended for women who have family members with BRCA-related cancers. BRCA-related cancers include breast, ovarian, tubal, and peritoneal cancers. Having family members with these cancers may be associated with an increased risk for harmful changes (mutations) in the breast cancer genes BRCA1 and BRCA2. Results of the assessment will determine the need for genetic counseling and BRCA1 and BRCA2 testing.  Routine pelvic exams to screen for cancer are no longer recommended for nonpregnant women who are considered low risk for cancer of the pelvic organs (ovaries, uterus, and vagina) and who do not have symptoms. Ask your health care provider if a screening pelvic exam is right for you.  If you have had past treatment for cervical cancer or a condition that could lead to cancer, you need Pap  tests and screening for cancer for at least 20 years after your treatment. If Pap tests have been discontinued, your risk factors (such as having a new sexual partner) need to be reassessed to determine if screening should be resumed. Some women have medical problems that increase the chance of getting cervical cancer. In these cases, your health care provider may recommend more frequent screening and Pap tests.  The HPV test is an additional test that may be used for cervical cancer screening. The HPV test looks for the virus that can cause the cell changes on the cervix. The cells collected during the Pap test can be tested for HPV. The HPV test could be used to screen women aged 35 years and older, and should be used in women of any age who have unclear Pap test results. After the age of 84, women should have HPV testing at the same frequency as a Pap test.  Colorectal cancer can be detected and often prevented. Most routine colorectal  cancer screening begins at the age of 69 years and continues through age 3 years. However, your health care provider may recommend screening at an earlier age if you have risk factors for colon cancer. On a yearly basis, your health care provider may provide home test kits to check for hidden blood in the stool. Use of a small camera at the end of a tube, to directly examine the colon (sigmoidoscopy or colonoscopy), can detect the earliest forms of colorectal cancer. Talk to your health care provider about this at age 30, when routine screening begins. Direct exam of the colon should be repeated every 5-10 years through age 72 years, unless early forms of pre-cancerous polyps or small growths are found.  People who are at an increased risk for hepatitis B should be screened for this virus. You are considered at high risk for hepatitis B if:  You were born in a country where hepatitis B occurs often. Talk with your health care provider about which countries are considered high risk.  Your parents were born in a high-risk country and you have not received a shot to protect against hepatitis B (hepatitis B vaccine).  You have HIV or AIDS.  You use needles to inject street drugs.  You live with, or have sex with, someone who has hepatitis B.  You get hemodialysis treatment.  You take certain medicines for conditions like cancer, organ transplantation, and autoimmune conditions.  Hepatitis C blood testing is recommended for all people born from [birth year] through 1965 and any individual with known risks for hepatitis C.  Practice safe sex. Use condoms and avoid high-risk sexual practices to reduce the spread of sexually transmitted infections (STIs). STIs include gonorrhea, chlamydia, syphilis, trichomonas, herpes, HPV, and human immunodeficiency virus (HIV). Herpes, HIV, and HPV are viral illnesses that have no cure. They can result in disability, cancer, and death.  You should be screened for sexually  transmitted illnesses (STIs) including gonorrhea and chlamydia if:  You are sexually active and are younger than 24 years.  You are older than 24 years and your health care provider tells you that you are at risk for this type of infection.  Your sexual activity has changed since you were last screened and you are at an increased risk for chlamydia or gonorrhea. Ask your health care provider if you are at risk.  If you are at risk of being infected with HIV, it is recommended that you take a prescription medicine daily to prevent  HIV infection. This is called preexposure prophylaxis (PrEP). You are considered at risk if:  You are a heterosexual woman, are sexually active, and are at increased risk for HIV infection.  You take drugs by injection.  You are sexually active with a partner who has HIV.  Talk with your health care provider about whether you are at high risk of being infected with HIV. If you choose to begin PrEP, you should first be tested for HIV. You should then be tested every 3 months for as long as you are taking PrEP.  Osteoporosis is a disease in which the bones lose minerals and strength with aging. This can result in serious bone fractures or breaks. The risk of osteoporosis can be identified using a bone density scan. Women ages 103 years and over and women at risk for fractures or osteoporosis should discuss screening with their health care providers. Ask your health care provider whether you should take a calcium supplement or vitamin D to reduce the rate of osteoporosis.  Menopause can be associated with physical symptoms and risks. Hormone replacement therapy is available to decrease symptoms and risks. You should talk to your health care provider about whether hormone replacement therapy is right for you.  Use sunscreen. Apply sunscreen liberally and repeatedly throughout the day. You should seek shade when your shadow is shorter than you. Protect yourself by wearing  long sleeves, pants, a wide-brimmed hat, and sunglasses year round, whenever you are outdoors.  Once a month, do a whole body skin exam, using a mirror to look at the skin on your back. Tell your health care provider of new moles, moles that have irregular borders, moles that are larger than a pencil eraser, or moles that have changed in shape or color.  Stay current with required vaccines (immunizations).  Influenza vaccine. All adults should be immunized every year.  Tetanus, diphtheria, and acellular pertussis (Td, Tdap) vaccine. Pregnant women should receive 1 dose of Tdap vaccine during each pregnancy. The dose should be obtained regardless of the length of time since the last dose. Immunization is preferred during the 27th-36th week of gestation. An adult who has not previously received Tdap or who does not know her vaccine status should receive 1 dose of Tdap. This initial dose should be followed by tetanus and diphtheria toxoids (Td) booster doses every 10 years. Adults with an unknown or incomplete history of completing a 3-dose immunization series with Td-containing vaccines should begin or complete a primary immunization series including a Tdap dose. Adults should receive a Td booster every 10 years.  Varicella vaccine. An adult without evidence of immunity to varicella should receive 2 doses or a second dose if she has previously received 1 dose. Pregnant females who do not have evidence of immunity should receive the first dose after pregnancy. This first dose should be obtained before leaving the health care facility. The second dose should be obtained 4-8 weeks after the first dose.  Human papillomavirus (HPV) vaccine. Females aged 13-26 years who have not received the vaccine previously should obtain the 3-dose series. The vaccine is not recommended for use in pregnant females. However, pregnancy testing is not needed before receiving a dose. If a female is found to be pregnant after  receiving a dose, no treatment is needed. In that case, the remaining doses should be delayed until after the pregnancy. Immunization is recommended for any person with an immunocompromised condition through the age of 18 years if she did not get  any or all doses earlier. During the 3-dose series, the second dose should be obtained 4-8 weeks after the first dose. The third dose should be obtained 24 weeks after the first dose and 16 weeks after the second dose.  Zoster vaccine. One dose is recommended for adults aged 56 years or older unless certain conditions are present.  Measles, mumps, and rubella (MMR) vaccine. Adults born before [birth year] generally are considered immune to measles and mumps. Adults born in [birth year] or later should have 1 or more doses of MMR vaccine unless there is a contraindication to the vaccine or there is laboratory evidence of immunity to each of the three diseases. A routine second dose of MMR vaccine should be obtained at least 28 days after the first dose for students attending postsecondary schools, health care workers, or international travelers. People who received inactivated measles vaccine or an unknown type of measles vaccine during 1963-1967 should receive 2 doses of MMR vaccine. People who received inactivated mumps vaccine or an unknown type of mumps vaccine before 1979 and are at high risk for mumps infection should consider immunization with 2 doses of MMR vaccine. For females of childbearing age, rubella immunity should be determined. If there is no evidence of immunity, females who are not pregnant should be vaccinated. If there is no evidence of immunity, females who are pregnant should delay immunization until after pregnancy. Unvaccinated health care workers born before [birth year] who lack laboratory evidence of measles, mumps, or rubella immunity or laboratory confirmation of disease should consider measles and mumps immunization with 2 doses of MMR vaccine or rubella  immunization with 1 dose of MMR vaccine.  Pneumococcal 13-valent conjugate (PCV13) vaccine. When indicated, a person who is uncertain of her immunization history and has no record of immunization should receive the PCV13 vaccine. An adult aged 20 years or older who has certain medical conditions and has not been previously immunized should receive 1 dose of PCV13 vaccine. This PCV13 should be followed with a dose of pneumococcal polysaccharide (PPSV23) vaccine. The PPSV23 vaccine dose should be obtained at least 8 weeks after the dose of PCV13 vaccine. An adult aged 73 years or older who has certain medical conditions and previously received 1 or more doses of PPSV23 vaccine should receive 1 dose of PCV13. The PCV13 vaccine dose should be obtained 1 or more years after the last PPSV23 vaccine dose.  Pneumococcal polysaccharide (PPSV23) vaccine. When PCV13 is also indicated, PCV13 should be obtained first. All adults aged 67 years and older should be immunized. An adult younger than age 80 years who has certain medical conditions should be immunized. Any person who resides in a nursing home or long-term care facility should be immunized. An adult smoker should be immunized. People with an immunocompromised condition and certain other conditions should receive both PCV13 and PPSV23 vaccines. People with human immunodeficiency virus (HIV) infection should be immunized as soon as possible after diagnosis. Immunization during chemotherapy or radiation therapy should be avoided. Routine use of PPSV23 vaccine is not recommended for American Indians, [place] Natives, or people younger than 65 years unless there are medical conditions that require PPSV23 vaccine. When indicated, people who have unknown immunization and have no record of immunization should receive PPSV23 vaccine. One-time revaccination 5 years after the first dose of PPSV23 is recommended for people aged 19-64 years who have chronic kidney failure,  nephrotic syndrome, asplenia, or immunocompromised conditions. People who received 1-2 doses of PPSV23 before age 36 years should receive  another dose of PPSV23 vaccine at age 16 years or later if at least 5 years have passed since the previous dose. Doses of PPSV23 are not needed for people immunized with PPSV23 at or after age 37 years.  Meningococcal vaccine. Adults with asplenia or persistent complement component deficiencies should receive 2 doses of quadrivalent meningococcal conjugate (MenACWY-D) vaccine. The doses should be obtained at least 2 months apart. Microbiologists working with certain meningococcal bacteria, [place] recruits, people at risk during an outbreak, and people who travel to or live in countries with a high rate of meningitis should be immunized. A first-year college student up through age 70 years who is living in a residence hall should receive a dose if she did not receive a dose on or after her 16th birthday. Adults who have certain high-risk conditions should receive one or more doses of vaccine.  Hepatitis A vaccine. Adults who wish to be protected from this disease, have certain high-risk conditions, work with hepatitis A-infected animals, work in hepatitis A research labs, or travel to or work in countries with a high rate of hepatitis A should be immunized. Adults who were previously unvaccinated and who anticipate close contact with an international adoptee during the first 60 days after arrival in the [place] from a country with a high rate of hepatitis A should be immunized.  Hepatitis B vaccine. Adults who wish to be protected from this disease, have certain high-risk conditions, may be exposed to blood or other infectious body fluids, are household contacts or sex partners of hepatitis B positive people, are clients or workers in certain care facilities, or travel to or work in countries with a high rate of hepatitis B should be immunized.  Haemophilus  influenzae type b (Hib) vaccine. A previously unvaccinated person with asplenia or sickle cell disease or having a scheduled splenectomy should receive 1 dose of Hib vaccine. Regardless of previous immunization, a recipient of a hematopoietic stem cell transplant should receive a 3-dose series 6-12 months after her successful transplant. Hib vaccine is not recommended for adults with HIV infection. Preventive Services / Frequency Ages 37 to 45 years  Blood pressure check.** / Every 1 to 2 years.  Lipid and cholesterol check.** / Every 5 years beginning at age 78.  Clinical breast exam.** / Every 3 years for women in their 60s and 50s.  BRCA-related cancer risk assessment.** / For women who have family members with a BRCA-related cancer (breast, ovarian, tubal, or peritoneal cancers).  Pap test.** / Every 2 years from ages 63 through 24. Every 3 years starting at age 57 through age 67 or 38 with a history of 3 consecutive normal Pap tests.  HPV screening.** / Every 3 years from ages 61 through ages 34 to 30 with a history of 3 consecutive normal Pap tests.  Hepatitis C blood test.** / For any individual with known risks for hepatitis C.  Skin self-exam. / Monthly.  Influenza vaccine. / Every year.  Tetanus, diphtheria, and acellular pertussis (Tdap, Td) vaccine.** / Consult your health care provider. Pregnant women should receive 1 dose of Tdap vaccine during each pregnancy. 1 dose of Td every 10 years.  Varicella vaccine.** / Consult your health care provider. Pregnant females who do not have evidence of immunity should receive the first dose after pregnancy.  HPV vaccine. / 3 doses over 6 months, if 50 and younger. The vaccine is not recommended for use in pregnant females. However, pregnancy testing is not needed before receiving  a dose.  Measles, mumps, rubella (MMR) vaccine.** / You need at least 1 dose of MMR if you were born in 1957 or later. You may also need a 2nd dose. For  females of childbearing age, rubella immunity should be determined. If there is no evidence of immunity, females who are not pregnant should be vaccinated. If there is no evidence of immunity, females who are pregnant should delay immunization until after pregnancy.  Pneumococcal 13-valent conjugate (PCV13) vaccine.** / Consult your health care provider.  Pneumococcal polysaccharide (PPSV23) vaccine.** / 1 to 2 doses if you smoke cigarettes or if you have certain conditions.  Meningococcal vaccine.** / 1 dose if you are age 35 to 8 years and a Market researcher living in a residence hall, or have one of several medical conditions, you need to get vaccinated against meningococcal disease. You may also need additional booster doses.  Hepatitis A vaccine.** / Consult your health care provider.  Hepatitis B vaccine.** / Consult your health care provider.  Haemophilus influenzae type b (Hib) vaccine.** / Consult your health care provider. Ages 47 to 43 years  Blood pressure check.** / Every 1 to 2 years.  Lipid and cholesterol check.** / Every 5 years beginning at age 76 years.  Lung cancer screening. / Every year if you are aged 62-80 years and have a 30-pack-year history of smoking and currently smoke or have quit within the past 15 years. Yearly screening is stopped once you have quit smoking for at least 15 years or develop a health problem that would prevent you from having lung cancer treatment.  Clinical breast exam.** / Every year after age 25 years.  BRCA-related cancer risk assessment.** / For women who have family members with a BRCA-related cancer (breast, ovarian, tubal, or peritoneal cancers).  Mammogram.** / Every year beginning at age 50 years and continuing for as long as you are in good health. Consult with your health care provider.  Pap test.** / Every 3 years starting at age 56 years through age 30 or 10 years with a history of 3 consecutive normal Pap  tests.  HPV screening.** / Every 3 years from ages 88 years through ages 4 to 90 years with a history of 3 consecutive normal Pap tests.  Fecal occult blood test (FOBT) of stool. / Every year beginning at age 76 years and continuing until age 58 years. You may not need to do this test if you get a colonoscopy every 10 years.  Flexible sigmoidoscopy or colonoscopy.** / Every 5 years for a flexible sigmoidoscopy or every 10 years for a colonoscopy beginning at age 40 years and continuing until age 48 years.  Hepatitis C blood test.** / For all people born from [birth year] through 1965 and any individual with known risks for hepatitis C.  Skin self-exam. / Monthly.  Influenza vaccine. / Every year.  Tetanus, diphtheria, and acellular pertussis (Tdap/Td) vaccine.** / Consult your health care provider. Pregnant women should receive 1 dose of Tdap vaccine during each pregnancy. 1 dose of Td every 10 years.  Varicella vaccine.** / Consult your health care provider. Pregnant females who do not have evidence of immunity should receive the first dose after pregnancy.  Zoster vaccine.** / 1 dose for adults aged 62 years or older.  Measles, mumps, rubella (MMR) vaccine.** / You need at least 1 dose of MMR if you were born in 1957 or later. You may also need a 2nd dose. For females of childbearing age, rubella immunity should  be determined. If there is no evidence of immunity, females who are not pregnant should be vaccinated. If there is no evidence of immunity, females who are pregnant should delay immunization until after pregnancy.  Pneumococcal 13-valent conjugate (PCV13) vaccine.** / Consult your health care provider.  Pneumococcal polysaccharide (PPSV23) vaccine.** / 1 to 2 doses if you smoke cigarettes or if you have certain conditions.  Meningococcal vaccine.** / Consult your health care provider.  Hepatitis A vaccine.** / Consult your health care provider.  Hepatitis B vaccine.** / Consult your  health care provider.  Haemophilus influenzae type b (Hib) vaccine.** / Consult your health care provider. Ages 81 years and over  Blood pressure check.** / Every 1 to 2 years.  Lipid and cholesterol check.** / Every 5 years beginning at age 38 years.  Lung cancer screening. / Every year if you are aged 73-80 years and have a 30-pack-year history of smoking and currently smoke or have quit within the past 15 years. Yearly screening is stopped once you have quit smoking for at least 15 years or develop a health problem that would prevent you from having lung cancer treatment.  Clinical breast exam.** / Every year after age 51 years.  BRCA-related cancer risk assessment.** / For women who have family members with a BRCA-related cancer (breast, ovarian, tubal, or peritoneal cancers).  Mammogram.** / Every year beginning at age 32 years and continuing for as long as you are in good health. Consult with your health care provider.  Pap test.** / Every 3 years starting at age 58 years through age 50 or 44 years with 3 consecutive normal Pap tests. Testing can be stopped between 65 and 70 years with 3 consecutive normal Pap tests and no abnormal Pap or HPV tests in the past 10 years.  HPV screening.** / Every 3 years from ages 43 years through ages 15 or 37 years with a history of 3 consecutive normal Pap tests. Testing can be stopped between 65 and 70 years with 3 consecutive normal Pap tests and no abnormal Pap or HPV tests in the past 10 years.  Fecal occult blood test (FOBT) of stool. / Every year beginning at age 25 years and continuing until age 42 years. You may not need to do this test if you get a colonoscopy every 10 years.  Flexible sigmoidoscopy or colonoscopy.** / Every 5 years for a flexible sigmoidoscopy or every 10 years for a colonoscopy beginning at age 33 years and continuing until age 37 years.  Hepatitis C blood test.** / For all people born from [birth year] through 1964 and any  individual with known risks for hepatitis C.  Osteoporosis screening.** / A one-time screening for women ages 25 years and over and women at risk for fractures or osteoporosis.  Skin self-exam. / Monthly.  Influenza vaccine. / Every year.  Tetanus, diphtheria, and acellular pertussis (Tdap/Td) vaccine.** / 1 dose of Td every 10 years.  Varicella vaccine.** / Consult your health care provider.  Zoster vaccine.** / 1 dose for adults aged 30 years or older.  Pneumococcal 13-valent conjugate (PCV13) vaccine.** / Consult your health care provider.  Pneumococcal polysaccharide (PPSV23) vaccine.** / 1 dose for all adults aged 60 years and older.  Meningococcal vaccine.** / Consult your health care provider.  Hepatitis A vaccine.** / Consult your health care provider.  Hepatitis B vaccine.** / Consult your health care provider.  Haemophilus influenzae type b (Hib) vaccine.** / Consult your health care provider. ** Family history and personal  history of risk and conditions may change your health care provider's recommendations. Document Released: 06/10/2001 Document Revised: 08/29/2013 Document Reviewed: 09/09/2010 [facility] Patient Information [address], [place]. This information is not intended to replace advice given to you by your health care provider. Make sure you discuss any questions you have with your health care provider.     Why follow it? Research shows. . Those who follow the Mediterranean diet have a reduced risk of heart disease  . The diet is associated with a reduced incidence of Parkinson's and Alzheimer's diseases . People following the diet may have longer life expectancies and lower rates of chronic diseases  . The Dietary Guidelines for Americans recommends the Mediterranean diet as an eating plan to promote health and prevent disease  What Is the Mediterranean Diet?  . Healthy eating plan based on typical foods and recipes of Mediterranean-style cooking . The diet  is primarily a plant based diet; these foods should make up a majority of meals   Starches - Plant based foods should make up a majority of meals - They are an important sources of vitamins, minerals, energy, antioxidants, and fiber - Choose whole grains, foods high in fiber and minimally processed items  - Typical grain sources include wheat, oats, barley, corn, brown rice, bulgar, farro, millet, polenta, couscous  - Various types of beans include chickpeas, lentils, fava beans, black beans, white beans   Fruits  Veggies - Large quantities of antioxidant rich fruits & veggies; 6 or more servings  - Vegetables can be eaten raw or lightly drizzled with oil and cooked  - Vegetables common to the traditional Mediterranean Diet include: artichokes, arugula, beets, broccoli, brussel sprouts, cabbage, carrots, celery, collard greens, cucumbers, eggplant, kale, leeks, lemons, lettuce, mushrooms, okra, onions, peas, peppers, potatoes, pumpkin, radishes, rutabaga, shallots, spinach, sweet potatoes, turnips, zucchini - Fruits common to the Mediterranean Diet include: apples, apricots, avocados, cherries, clementines, dates, figs, grapefruits, grapes, melons, nectarines, oranges, peaches, pears, pomegranates, strawberries, tangerines  Fats - Replace butter and margarine with healthy oils, such as olive oil, canola oil, and tahini  - Limit nuts to no more than a handful a day  - Nuts include walnuts, almonds, pecans, pistachios, pine nuts  - Limit or avoid candied, honey roasted or heavily salted nuts - Olives are central to the [employer] - can be eaten whole or used in a variety of dishes   Meats Protein - Limiting red meat: no more than a few times a month - When eating red meat: choose lean cuts and keep the portion to the size of deck of cards - Eggs: approx. 0 to 4 times a week  - Fish and lean poultry: at least 2 a week  - Healthy protein sources include, chicken, [place], lean beef, lamb -  Increase intake of seafood such as tuna, salmon, trout, mackerel, shrimp, scallops - Avoid or limit high fat processed meats such as sausage and bacon  Dairy - Include moderate amounts of low fat dairy products  - Focus on healthy dairy such as fat free yogurt, skim milk, low or reduced fat cheese - Limit dairy products higher in fat such as whole or 2% milk, cheese, ice cream  Alcohol - Moderate amounts of red wine is ok  - No more than 5 oz daily for women (all ages) and men older than age 71  - No more than 10 oz of wine daily for men younger than 80  Other - Limit sweets and other  desserts  - Use herbs and spices instead of salt to flavor foods  - Herbs and spices common to the traditional Mediterranean Diet include: basil, bay leaves, chives, cloves, cumin, fennel, garlic, lavender, marjoram, mint, oregano, parsley, pepper, rosemary, sage, savory, sumac, tarragon, thyme   It's not just a diet, it's a lifestyle:  . The Mediterranean diet includes lifestyle factors typical of those in the region  . Foods, drinks and meals are best eaten with others and savored . Daily physical activity is important for overall good health . This could be strenuous exercise like running and aerobics . This could also be more leisurely activities such as walking, housework, yard-work, or taking the stairs . Moderation is the key; a balanced and healthy diet accommodates most foods and drinks . Consider portion sizes and frequency of consumption of certain foods   Meal Ideas & Options:  . Breakfast:  o Whole wheat toast or whole wheat English muffins with peanut butter & hard boiled egg o Steel cut oats topped with apples & cinnamon and skim milk  o Fresh fruit: banana, strawberries, melon, berries, peaches  o Smoothies: strawberries, bananas, greek yogurt, peanut butter o Low fat greek yogurt with blueberries and granola  o Egg white omelet with spinach and mushrooms o Breakfast couscous: whole wheat  couscous, apricots, skim milk, cranberries  . Sandwiches:  o Hummus and grilled vegetables (peppers, zucchini, squash) on whole wheat bread   o Grilled chicken on whole wheat pita with lettuce, tomatoes, cucumbers or tzatziki  o Tuna salad on whole wheat bread: tuna salad made with greek yogurt, olives, red peppers, capers, green onions o Garlic rosemary lamb pita: lamb sauted with garlic, rosemary, salt & pepper; add lettuce, cucumber, greek yogurt to pita - flavor with lemon juice and black pepper  . Seafood:  o Mediterranean grilled salmon, seasoned with garlic, basil, parsley, lemon juice and black pepper o Shrimp, lemon, and spinach whole-grain pasta salad made with low fat greek yogurt  o Seared scallops with lemon orzo  o Seared tuna steaks seasoned salt, pepper, coriander topped with tomato mixture of olives, tomatoes, olive oil, minced garlic, parsley, green onions and cappers  . Meats:  o Herbed greek chicken salad with kalamata olives, cucumber, feta  o Red bell peppers stuffed with spinach, bulgur, lean ground beef (or lentils) & topped with feta   o Kebabs: skewers of chicken, tomatoes, onions, zucchini, squash  o [place] burgers: made with red onions, mint, dill, lemon juice, feta cheese topped with roasted red peppers . Vegetarian o Cucumber salad: cucumbers, artichoke hearts, celery, red onion, feta cheese, tossed in olive oil & lemon juice  o Hummus and whole grain pita points with a greek salad (lettuce, tomato, feta, olives, cucumbers, red onion) o Lentil soup with celery, carrots made with vegetable broth, garlic, salt and pepper  o Tabouli salad: parsley, bulgur, mint, scallions, cucumbers, tomato, radishes, lemon juice, olive oil, salt and pepper.

## 2014-11-22 NOTE — Progress Notes (Signed)
Pre visit review using our clinic review tool, if applicable. No additional management support is needed unless otherwise documented below in the visit note.  Chief Complaint  Patient presents with  . Annual Exam    HPI: Patient  [name]  39 y.o. comes in today for new patient transfer [place] visit  Last seen 2014  Generally healthy   No diseases on ocps   Per gyne    Wants to get checked exam an risk assessment.  Doing well   Health Maintenance  Topic Date Due  . HIV Screening  10/27/2015 (Originally 10/29/1990)  . INFLUENZA VACCINE  11/27/2014  . PAP SMEAR  02/26/2017  . TETANUS/TDAP  05/26/2017   Health Maintenance Review LIFESTYLE:  Exercise:  yes Tobacco/ETS:no Alcohol: rare to ocassional Sugar beverages:no Sleep: 8 hours  Drug use: no  ROS:  Had ocass sharp left breast pain  No mass  GEN/ HEENT: No fever, significant weight changes sweats headaches vision problems hearing changes, CV/ PULM; No chest pain shortness of breath cough, syncope,edema  change in exercise tolerance. GI /GU: No adominal pain, vomiting, change in bowel habits. No blood in the stool. No significant GU symptoms. SKIN/HEME: ,no acute skin rashes suspicious lesions or bleeding. No lymphadenopathy, nodules, masses.  NEURO/ PSYCH:  No neurologic signs such as weakness numbness. No depression anxiety. IMM/ Allergy: No unusual infections.  Allergy .   REST of 12 system review negative except as per HPI   Past Medical History  Diagnosis Date  . Allergy     Past Surgical History  Procedure Laterality Date  . Mole removal      Family History  Problem Relation Age of Onset  . Breast cancer Maternal Grandmother   . Hyperlipidemia Brother     [employer]  . Hyperlipidemia Brother     Little Brother  . Hypertension Brother     also blocked artery ? opened up.    History   Social History  . Marital Status: Married    Spouse Name: N/A  . Number of Children: N/A  . Years of  Education: N/A   Social History Main Topics  . Smoking status: Never Smoker   . Smokeless tobacco: Never Used  . Alcohol Use: 0.0 oz/week    0 Standard drinks or equivalent per week  . Drug Use: No  . Sexual Activity: Not on file   Other Topics Concern  . 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   8+ hours of sleep per night   1 daughter (six years of age) hh of 3       Neg td  ocass etoh    Exercise .    Outpatient Prescriptions Prior to Visit  Medication Sig Dispense Refill  . JUNEL FE 1/20 1-20 MG-MCG tablet     . albuterol (PROVENTIL HFA;VENTOLIN HFA) 108 (90 BASE) MCG/ACT inhaler Use 2 inhalations 4 times daily as needed for wheezing and coughing 1 Inhaler 0  . azithromycin (ZITHROMAX) 250 MG tablet Take 2 initially on day 1, then 1 daily for 4 days 6 tablet 0  . benzonatate (TESSALON) 100 MG capsule Take 1-2 capsules (100-200 mg total) by mouth 3 (three) times daily as needed for cough. 40 capsule 0  . predniSONE (DELTASONE) 20 MG tablet Take 3 daily for 2 days, then 2 daily for 2 days, then 1 daily for 2  days. 12 tablet 0   No facility-administered medications prior to visit.     EXAM:  BP 116/80 mmHg  Temp(Src) 98.2 F (36.8 C) (Oral)  Ht $R'5\' 9"'Er$  (1.753 m)  Wt 128 lb 6.4 oz (58.242 kg)  BMI 18.95 kg/m2  LMP 10/29/2014  Body mass index is 18.95 kg/(m^2).  Physical Exam: Vital signs reviewed NAT:FTDD is a well-developed well-nourished alert cooperative    who appearsr stated age in no acute distress.  HEENT: normocephalic atraumatic , Eyes: PERRL EOM's full, conjunctiva clear, Nares: paten,t no deformity discharge or tenderness., Ears: no deformity EAC's clear TMs with normal landmarks. Mouth: clear OP, no lesions, edema.  Moist mucous membranes. Dentition in adequate repair. NECK: supple without masses, thyromegaly or bruits. CHEST/PULM:  Clear to auscultation and  percussion breath sounds equal no wheeze , rales or rhonchi. No chest wall deformities or tenderness.Breast: normal by inspection . No dimpling, discharge, masses, tenderness or discharge . CV: PMI is nondisplaced, S1 S2 no gallops, murmurs, rubs. Peripheral pulses are full without delay.No JVD .  ABDOMEN: Bowel sounds normal nontender  No guard or rebound, no hepato splenomegal no CVA tenderness.  No hernia. Extremtities:  No clubbing cyanosis or edema, no acute joint swelling or redness no focal atrophy NEURO:  Oriented x3, cranial nerves 3-12 appear to be intact, no obvious focal weakness,gait within normal limits no abnormal reflexes or asymmetrical SKIN: No acute rashes normal turgor, color, no bruising or petechiae. PSYCH: Oriented, good eye contact, no obvious depression anxiety, cognition and judgment appear normal. LN: no cervical axillary inguinal adenopathy    ASSESSMENT AND PLAN:  Discussed the following assessment and plan:  Visit for preventive health examination - healthy  fam hx of elevated lipids contineut healthy lifestyle - Plan: Basic metabolic panel, CBC with Differential/Platelet, Hepatic function panel, Lipid panel, TSH Counseled regarding healthy nutrition, exercise, sleep, injury prevention, calcium vit d and healthy weight . Self skin check wellness in 2 years or as needed Patient Care Team: Lisabeth Pick, MD as PCP - General Princess Bruins, MD as Consulting Physician (Obstetrics and Gynecology) Patient Instructions   Will notify you  of labs when available. Healthy lifestyle includes : At least 150 minutes of exercise weeks  , weight at healthy levels, which is usually   BMI 19-25. Avoid trans fats and processed foods;  Increase fresh fruits and veges to 5 servings per day. And avoid sweet beverages including tea and juice. Mediterranean diet with olive oil and nuts have been noted to be heart and brain healthy . Avoid tobacco products . Limit  alcohol to   7 per week for women and 14 servings for men.  Get adequate sleep . Wear seat belts . Don't text and drive .   Preventive Care for Adults A healthy lifestyle and preventive care can promote health and wellness. Preventive health guidelines for women include the following key practices.  A routine yearly physical is a good way to check with your health care provider about your health and preventive screening. It is a chance to share any concerns and updates on your health and to receive a thorough exam.  Visit your dentist for a routine exam and preventive care every 6 months. Brush your teeth twice a day and floss once a day. Good oral hygiene prevents tooth decay and gum disease.  The frequency of eye exams is based on your age, health, family medical history, use of contact lenses, and other factors. Follow your health care  provider's recommendations for frequency of eye exams.  Eat a healthy diet. Foods like vegetables, fruits, whole grains, low-fat dairy products, and lean protein foods contain the nutrients you need without too many calories. Decrease your intake of foods high in solid fats, added sugars, and salt. Eat the right amount of calories for you.Get information about a proper diet from your health care provider, if necessary.  Regular physical exercise is one of the most important things you can do for your health. Most adults should get at least 150 minutes of moderate-intensity exercise (any activity that increases your heart rate and causes you to sweat) each week. In addition, most adults need muscle-strengthening exercises on 2 or more days a week.  Maintain a healthy weight. The body mass index (BMI) is a screening tool to identify possible weight problems. It provides an estimate of body fat based on height and weight. Your health care provider can find your BMI and can help you achieve or maintain a healthy weight.For adults 20 years and older:  A BMI below 18.5 is  considered underweight.  A BMI of 18.5 to 24.9 is normal.  A BMI of 25 to 29.9 is considered overweight.  A BMI of 30 and above is considered obese.  Maintain normal blood lipids and cholesterol levels by exercising and minimizing your intake of saturated fat. Eat a balanced diet with plenty of fruit and vegetables. Blood tests for lipids and cholesterol should begin at age 78 and be repeated every 5 years. If your lipid or cholesterol levels are high, you are over 50, or you are at high risk for heart disease, you may need your cholesterol levels checked more frequently.Ongoing high lipid and cholesterol levels should be treated with medicines if diet and exercise are not working.  If you smoke, find out from your health care provider how to quit. If you do not use tobacco, do not start.  Lung cancer screening is recommended for adults aged 65-80 years who are at high risk for developing lung cancer because of a history of smoking. A yearly low-dose CT scan of the lungs is recommended for people who have at least a 30-pack-year history of smoking and are a current smoker or have quit within the past 15 years. A pack year of smoking is smoking an average of 1 pack of cigarettes a day for 1 year (for example: 1 pack a day for 30 years or 2 packs a day for 15 years). Yearly screening should continue until the smoker has stopped smoking for at least 15 years. Yearly screening should be stopped for people who develop a health problem that would prevent them from having lung cancer treatment.  If you are pregnant, do not drink alcohol. If you are breastfeeding, be very cautious about drinking alcohol. If you are not pregnant and choose to drink alcohol, do not have more than 1 drink per day. One drink is considered to be 12 ounces (355 mL) of beer, 5 ounces (148 mL) of wine, or 1.5 ounces (44 mL) of liquor.  Avoid use of street drugs. Do not share needles with anyone. Ask for help if you need support or  instructions about stopping the use of drugs.  High blood pressure causes heart disease and increases the risk of stroke. Your blood pressure should be checked at least every 1 to 2 years. Ongoing high blood pressure should be treated with medicines if weight loss and exercise do not work.  If you are 55-38  years old, ask your health care provider if you should take aspirin to prevent strokes.  Diabetes screening involves taking a blood sample to check your fasting blood sugar level. This should be done once every 3 years, after age 43, if you are within normal weight and without risk factors for diabetes. Testing should be considered at a younger age or be carried out more frequently if you are overweight and have at least 1 risk factor for diabetes.  Breast cancer screening is essential preventive care for women. You should practice "breast self-awareness." This means understanding the normal appearance and feel of your breasts and may include breast self-examination. Any changes detected, no matter how small, should be reported to a health care provider. Women in their 61s and 30s should have a clinical breast exam (CBE) by a health care provider as part of a regular health exam every 1 to 3 years. After age 37, women should have a CBE every year. Starting at age 50, women should consider having a mammogram (breast X-ray test) every year. Women who have a family history of breast cancer should talk to their health care provider about genetic screening. Women at a high risk of breast cancer should talk to their health care providers about having an MRI and a mammogram every year.  Breast cancer gene (BRCA)-related cancer risk assessment is recommended for women who have family members with BRCA-related cancers. BRCA-related cancers include breast, ovarian, tubal, and peritoneal cancers. Having family members with these cancers may be associated with an increased risk for harmful changes (mutations) in  the breast cancer genes BRCA1 and BRCA2. Results of the assessment will determine the need for genetic counseling and BRCA1 and BRCA2 testing.  Routine pelvic exams to screen for cancer are no longer recommended for nonpregnant women who are considered low risk for cancer of the pelvic organs (ovaries, uterus, and vagina) and who do not have symptoms. Ask your health care provider if a screening pelvic exam is right for you.  If you have had past treatment for cervical cancer or a condition that could lead to cancer, you need Pap tests and screening for cancer for at least 20 years after your treatment. If Pap tests have been discontinued, your risk factors (such as having a new sexual partner) need to be reassessed to determine if screening should be resumed. Some women have medical problems that increase the chance of getting cervical cancer. In these cases, your health care provider may recommend more frequent screening and Pap tests.  The HPV test is an additional test that may be used for cervical cancer screening. The HPV test looks for the virus that can cause the cell changes on the cervix. The cells collected during the Pap test can be tested for HPV. The HPV test could be used to screen women aged 86 years and older, and should be used in women of any age who have unclear Pap test results. After the age of 30, women should have HPV testing at the same frequency as a Pap test.  Colorectal cancer can be detected and often prevented. Most routine colorectal cancer screening begins at the age of 5 years and continues through age 31 years. However, your health care provider may recommend screening at an earlier age if you have risk factors for colon cancer. On a yearly basis, your health care provider may provide home test kits to check for hidden blood in the stool. Use of a small camera at the end  of a tube, to directly examine the colon (sigmoidoscopy or colonoscopy), can detect the earliest forms  of colorectal cancer. Talk to your health care provider about this at age 77, when routine screening begins. Direct exam of the colon should be repeated every 5-10 years through age 53 years, unless early forms of pre-cancerous polyps or small growths are found.  People who are at an increased risk for hepatitis B should be screened for this virus. You are considered at high risk for hepatitis B if:  You were born in a country where hepatitis B occurs often. Talk with your health care provider about which countries are considered high risk.  Your parents were born in a high-risk country and you have not received a shot to protect against hepatitis B (hepatitis B vaccine).  You have HIV or AIDS.  You use needles to inject street drugs.  You live with, or have sex with, someone who has hepatitis B.  You get hemodialysis treatment.  You take certain medicines for conditions like cancer, organ transplantation, and autoimmune conditions.  Hepatitis C blood testing is recommended for all people born from [birth year] through 1964 and any individual with known risks for hepatitis C.  Practice safe sex. Use condoms and avoid high-risk sexual practices to reduce the spread of sexually transmitted infections (STIs). STIs include gonorrhea, chlamydia, syphilis, trichomonas, herpes, HPV, and human immunodeficiency virus (HIV). Herpes, HIV, and HPV are viral illnesses that have no cure. They can result in disability, cancer, and death.  You should be screened for sexually transmitted illnesses (STIs) including gonorrhea and chlamydia if:  You are sexually active and are younger than 24 years.  You are older than 24 years and your health care provider tells you that you are at risk for this type of infection.  Your sexual activity has changed since you were last screened and you are at an increased risk for chlamydia or gonorrhea. Ask your health care provider if you are at risk.  If you are at risk of  being infected with HIV, it is recommended that you take a prescription medicine daily to prevent HIV infection. This is called preexposure prophylaxis (PrEP). You are considered at risk if:  You are a heterosexual woman, are sexually active, and are at increased risk for HIV infection.  You take drugs by injection.  You are sexually active with a partner who has HIV.  Talk with your health care provider about whether you are at high risk of being infected with HIV. If you choose to begin PrEP, you should first be tested for HIV. You should then be tested every 3 months for as long as you are taking PrEP.  Osteoporosis is a disease in which the bones lose minerals and strength with aging. This can result in serious bone fractures or breaks. The risk of osteoporosis can be identified using a bone density scan. Women ages 57 years and over and women at risk for fractures or osteoporosis should discuss screening with their health care providers. Ask your health care provider whether you should take a calcium supplement or vitamin D to reduce the rate of osteoporosis.  Menopause can be associated with physical symptoms and risks. Hormone replacement therapy is available to decrease symptoms and risks. You should talk to your health care provider about whether hormone replacement therapy is right for you.  Use sunscreen. Apply sunscreen liberally and repeatedly throughout the day. You should seek shade when your shadow is shorter than you. Protect  yourself by wearing long sleeves, pants, a wide-brimmed hat, and sunglasses year round, whenever you are outdoors.  Once a month, do a whole body skin exam, using a mirror to look at the skin on your back. Tell your health care provider of new moles, moles that have irregular borders, moles that are larger than a pencil eraser, or moles that have changed in shape or color.  Stay current with required vaccines (immunizations).  Influenza vaccine. All adults  should be immunized every year.  Tetanus, diphtheria, and acellular pertussis (Td, Tdap) vaccine. Pregnant women should receive 1 dose of Tdap vaccine during each pregnancy. The dose should be obtained regardless of the length of time since the last dose. Immunization is preferred during the 27th-36th week of gestation. An adult who has not previously received Tdap or who does not know her vaccine status should receive 1 dose of Tdap. This initial dose should be followed by tetanus and diphtheria toxoids (Td) booster doses every 10 years. Adults with an unknown or incomplete history of completing a 3-dose immunization series with Td-containing vaccines should begin or complete a primary immunization series including a Tdap dose. Adults should receive a Td booster every 10 years.  Varicella vaccine. An adult without evidence of immunity to varicella should receive 2 doses or a second dose if she has previously received 1 dose. Pregnant females who do not have evidence of immunity should receive the first dose after pregnancy. This first dose should be obtained before leaving the health care facility. The second dose should be obtained 4-8 weeks after the first dose.  Human papillomavirus (HPV) vaccine. Females aged 13-26 years who have not received the vaccine previously should obtain the 3-dose series. The vaccine is not recommended for use in pregnant females. However, pregnancy testing is not needed before receiving a dose. If a female is found to be pregnant after receiving a dose, no treatment is needed. In that case, the remaining doses should be delayed until after the pregnancy. Immunization is recommended for any person with an immunocompromised condition through the age of 78 years if she did not get any or all doses earlier. During the 3-dose series, the second dose should be obtained 4-8 weeks after the first dose. The third dose should be obtained 24 weeks after the first dose and 16 weeks after  the second dose.  Zoster vaccine. One dose is recommended for adults aged 66 years or older unless certain conditions are present.  Measles, mumps, and rubella (MMR) vaccine. Adults born before 18 generally are considered immune to measles and mumps. Adults born in 1 or later should have 1 or more doses of MMR vaccine unless there is a contraindication to the vaccine or there is laboratory evidence of immunity to each of the three diseases. A routine second dose of MMR vaccine should be obtained at least 28 days after the first dose for students attending postsecondary schools, health care workers, or international travelers. People who received inactivated measles vaccine or an unknown type of measles vaccine during 1963-1967 should receive 2 doses of MMR vaccine. People who received inactivated mumps vaccine or an unknown type of mumps vaccine before 1979 and are at high risk for mumps infection should consider immunization with 2 doses of MMR vaccine. For females of childbearing age, rubella immunity should be determined. If there is no evidence of immunity, females who are not pregnant should be vaccinated. If there is no evidence of immunity, females who are pregnant should delay  immunization until after pregnancy. Unvaccinated health care workers born before 18 who lack laboratory evidence of measles, mumps, or rubella immunity or laboratory confirmation of disease should consider measles and mumps immunization with 2 doses of MMR vaccine or rubella immunization with 1 dose of MMR vaccine.  Pneumococcal 13-valent conjugate (PCV13) vaccine. When indicated, a person who is uncertain of her immunization history and has no record of immunization should receive the PCV13 vaccine. An adult aged 62 years or older who has certain medical conditions and has not been previously immunized should receive 1 dose of PCV13 vaccine. This PCV13 should be followed with a dose of pneumococcal polysaccharide (PPSV23)  vaccine. The PPSV23 vaccine dose should be obtained at least 8 weeks after the dose of PCV13 vaccine. An adult aged 33 years or older who has certain medical conditions and previously received 1 or more doses of PPSV23 vaccine should receive 1 dose of PCV13. The PCV13 vaccine dose should be obtained 1 or more years after the last PPSV23 vaccine dose.  Pneumococcal polysaccharide (PPSV23) vaccine. When PCV13 is also indicated, PCV13 should be obtained first. All adults aged 55 years and older should be immunized. An adult younger than age 30 years who has certain medical conditions should be immunized. Any person who resides in a nursing home or long-term care facility should be immunized. An adult smoker should be immunized. People with an immunocompromised condition and certain other conditions should receive both PCV13 and PPSV23 vaccines. People with human immunodeficiency virus (HIV) infection should be immunized as soon as possible after diagnosis. Immunization during chemotherapy or radiation therapy should be avoided. Routine use of PPSV23 vaccine is not recommended for American Indians, [place] Natives, or people younger than 65 years unless there are medical conditions that require PPSV23 vaccine. When indicated, people who have unknown immunization and have no record of immunization should receive PPSV23 vaccine. One-time revaccination 5 years after the first dose of PPSV23 is recommended for people aged 19-64 years who have chronic kidney failure, nephrotic syndrome, asplenia, or immunocompromised conditions. People who received 1-2 doses of PPSV23 before age 93 years should receive another dose of PPSV23 vaccine at age 63 years or later if at least 5 years have passed since the previous dose. Doses of PPSV23 are not needed for people immunized with PPSV23 at or after age 62 years.  Meningococcal vaccine. Adults with asplenia or persistent complement component deficiencies should receive 2 doses of  quadrivalent meningococcal conjugate (MenACWY-D) vaccine. The doses should be obtained at least 2 months apart. Microbiologists working with certain meningococcal bacteria, [employer] recruits, people at risk during an outbreak, and people who travel to or live in countries with a high rate of meningitis should be immunized. A first-year college student up through age 47 years who is living in a residence hall should receive a dose if she did not receive a dose on or after her 16th birthday. Adults who have certain high-risk conditions should receive one or more doses of vaccine.  Hepatitis A vaccine. Adults who wish to be protected from this disease, have certain high-risk conditions, work with hepatitis A-infected animals, work in hepatitis A research labs, or travel to or work in countries with a high rate of hepatitis A should be immunized. Adults who were previously unvaccinated and who anticipate close contact with an international adoptee during the first 60 days after arrival in the [place] from a country with a high rate of hepatitis A should be immunized.  Hepatitis B  vaccine. Adults who wish to be protected from this disease, have certain high-risk conditions, may be exposed to blood or other infectious body fluids, are household contacts or sex partners of hepatitis B positive people, are clients or workers in certain care facilities, or travel to or work in countries with a high rate of hepatitis B should be immunized.  Haemophilus influenzae type b (Hib) vaccine. A previously unvaccinated person with asplenia or sickle cell disease or having a scheduled splenectomy should receive 1 dose of Hib vaccine. Regardless of previous immunization, a recipient of a hematopoietic stem cell transplant should receive a 3-dose series 6-12 months after her successful transplant. Hib vaccine is not recommended for adults with HIV infection. Preventive Services / Frequency Ages 38 to 9 years  Blood  pressure check.** / Every 1 to 2 years.  Lipid and cholesterol check.** / Every 5 years beginning at age 77.  Clinical breast exam.** / Every 3 years for women in their 59s and 33s.  BRCA-related cancer risk assessment.** / For women who have family members with a BRCA-related cancer (breast, ovarian, tubal, or peritoneal cancers).  Pap test.** / Every 2 years from ages 32 through 51. Every 3 years starting at age 43 through age 83 or 61 with a history of 3 consecutive normal Pap tests.  HPV screening.** / Every 3 years from ages 66 through ages 42 to 64 with a history of 3 consecutive normal Pap tests.  Hepatitis C blood test.** / For any individual with known risks for hepatitis C.  Skin self-exam. / Monthly.  Influenza vaccine. / Every year.  Tetanus, diphtheria, and acellular pertussis (Tdap, Td) vaccine.** / Consult your health care provider. Pregnant women should receive 1 dose of Tdap vaccine during each pregnancy. 1 dose of Td every 10 years.  Varicella vaccine.** / Consult your health care provider. Pregnant females who do not have evidence of immunity should receive the first dose after pregnancy.  HPV vaccine. / 3 doses over 6 months, if 31 and younger. The vaccine is not recommended for use in pregnant females. However, pregnancy testing is not needed before receiving a dose.  Measles, mumps, rubella (MMR) vaccine.** / You need at least 1 dose of MMR if you were born in 1957 or later. You may also need a 2nd dose. For females of childbearing age, rubella immunity should be determined. If there is no evidence of immunity, females who are not pregnant should be vaccinated. If there is no evidence of immunity, females who are pregnant should delay immunization until after pregnancy.  Pneumococcal 13-valent conjugate (PCV13) vaccine.** / Consult your health care provider.  Pneumococcal polysaccharide (PPSV23) vaccine.** / 1 to 2 doses if you smoke cigarettes or if you have certain  conditions.  Meningococcal vaccine.** / 1 dose if you are age 59 to 42 years and a Market researcher living in a residence hall, or have one of several medical conditions, you need to get vaccinated against meningococcal disease. You may also need additional booster doses.  Hepatitis A vaccine.** / Consult your health care provider.  Hepatitis B vaccine.** / Consult your health care provider.  Haemophilus influenzae type b (Hib) vaccine.** / Consult your health care provider. Ages 14 to 10 years  Blood pressure check.** / Every 1 to 2 years.  Lipid and cholesterol check.** / Every 5 years beginning at age 60 years.  Lung cancer screening. / Every year if you are aged 64-80 years and have a 30-pack-year history of smoking  and currently smoke or have quit within the past 15 years. Yearly screening is stopped once you have quit smoking for at least 15 years or develop a health problem that would prevent you from having lung cancer treatment.  Clinical breast exam.** / Every year after age 4 years.  BRCA-related cancer risk assessment.** / For women who have family members with a BRCA-related cancer (breast, ovarian, tubal, or peritoneal cancers).  Mammogram.** / Every year beginning at age 13 years and continuing for as long as you are in good health. Consult with your health care provider.  Pap test.** / Every 3 years starting at age 31 years through age 84 or 64 years with a history of 3 consecutive normal Pap tests.  HPV screening.** / Every 3 years from ages 42 years through ages 13 to 68 years with a history of 3 consecutive normal Pap tests.  Fecal occult blood test (FOBT) of stool. / Every year beginning at age 49 years and continuing until age 73 years. You may not need to do this test if you get a colonoscopy every 10 years.  Flexible sigmoidoscopy or colonoscopy.** / Every 5 years for a flexible sigmoidoscopy or every 10 years for a colonoscopy beginning at age 59 years  and continuing until age 23 years.  Hepatitis C blood test.** / For all people born from [birth year] through 1964 and any individual with known risks for hepatitis C.  Skin self-exam. / Monthly.  Influenza vaccine. / Every year.  Tetanus, diphtheria, and acellular pertussis (Tdap/Td) vaccine.** / Consult your health care provider. Pregnant women should receive 1 dose of Tdap vaccine during each pregnancy. 1 dose of Td every 10 years.  Varicella vaccine.** / Consult your health care provider. Pregnant females who do not have evidence of immunity should receive the first dose after pregnancy.  Zoster vaccine.** / 1 dose for adults aged 27 years or older.  Measles, mumps, rubella (MMR) vaccine.** / You need at least 1 dose of MMR if you were born in 1957 or later. You may also need a 2nd dose. For females of childbearing age, rubella immunity should be determined. If there is no evidence of immunity, females who are not pregnant should be vaccinated. If there is no evidence of immunity, females who are pregnant should delay immunization until after pregnancy.  Pneumococcal 13-valent conjugate (PCV13) vaccine.** / Consult your health care provider.  Pneumococcal polysaccharide (PPSV23) vaccine.** / 1 to 2 doses if you smoke cigarettes or if you have certain conditions.  Meningococcal vaccine.** / Consult your health care provider.  Hepatitis A vaccine.** / Consult your health care provider.  Hepatitis B vaccine.** / Consult your health care provider.  Haemophilus influenzae type b (Hib) vaccine.** / Consult your health care provider. Ages 57 years and over  Blood pressure check.** / Every 1 to 2 years.  Lipid and cholesterol check.** / Every 5 years beginning at age 50 years.  Lung cancer screening. / Every year if you are aged 63-80 years and have a 30-pack-year history of smoking and currently smoke or have quit within the past 15 years. Yearly screening is stopped once you have quit smoking  for at least 15 years or develop a health problem that would prevent you from having lung cancer treatment.  Clinical breast exam.** / Every year after age 40 years.  BRCA-related cancer risk assessment.** / For women who have family members with a BRCA-related cancer (breast, ovarian, tubal, or peritoneal cancers).  Mammogram.** / Every year  beginning at age 47 years and continuing for as long as you are in good health. Consult with your health care provider.  Pap test.** / Every 3 years starting at age 62 years through age 32 or 93 years with 3 consecutive normal Pap tests. Testing can be stopped between 65 and 70 years with 3 consecutive normal Pap tests and no abnormal Pap or HPV tests in the past 10 years.  HPV screening.** / Every 3 years from ages 25 years through ages 73 or 86 years with a history of 3 consecutive normal Pap tests. Testing can be stopped between 65 and 70 years with 3 consecutive normal Pap tests and no abnormal Pap or HPV tests in the past 10 years.  Fecal occult blood test (FOBT) of stool. / Every year beginning at age 98 years and continuing until age 4 years. You may not need to do this test if you get a colonoscopy every 10 years.  Flexible sigmoidoscopy or colonoscopy.** / Every 5 years for a flexible sigmoidoscopy or every 10 years for a colonoscopy beginning at age 41 years and continuing until age 59 years.  Hepatitis C blood test.** / For all people born from [birth year] through 1964 and any individual with known risks for hepatitis C.  Osteoporosis screening.** / A one-time screening for women ages 106 years and over and women at risk for fractures or osteoporosis.  Skin self-exam. / Monthly.  Influenza vaccine. / Every year.  Tetanus, diphtheria, and acellular pertussis (Tdap/Td) vaccine.** / 1 dose of Td every 10 years.  Varicella vaccine.** / Consult your health care provider.  Zoster vaccine.** / 1 dose for adults aged 50 years or older.  Pneumococcal  13-valent conjugate (PCV13) vaccine.** / Consult your health care provider.  Pneumococcal polysaccharide (PPSV23) vaccine.** / 1 dose for all adults aged 31 years and older.  Meningococcal vaccine.** / Consult your health care provider.  Hepatitis A vaccine.** / Consult your health care provider.  Hepatitis B vaccine.** / Consult your health care provider.  Haemophilus influenzae type b (Hib) vaccine.** / Consult your health care provider. ** Family history and personal history of risk and conditions may change your health care provider's recommendations. Document Released: 06/10/2001 Document Revised: 08/29/2013 Document Reviewed: 09/09/2010 [facility] Patient Information 2015 [place], [place]. This information is not intended to replace advice given to you by your health care provider. Make sure you discuss any questions you have with your health care provider.     Why follow it? Research shows. . Those who follow the Mediterranean diet have a reduced risk of heart disease  . The diet is associated with a reduced incidence of Parkinson's and Alzheimer's diseases . People following the diet may have longer life expectancies and lower rates of chronic diseases  . The Dietary Guidelines for Americans recommends the Mediterranean diet as an eating plan to promote health and prevent disease  What Is the Mediterranean Diet?  . Healthy eating plan based on typical foods and recipes of Mediterranean-style cooking . The diet is primarily a plant based diet; these foods should make up a majority of meals   Starches - Plant based foods should make up a majority of meals - They are an important sources of vitamins, minerals, energy, antioxidants, and fiber - Choose whole grains, foods high in fiber and minimally processed items  - Typical grain sources include wheat, oats, barley, corn, brown rice, bulgar, farro, millet, polenta, couscous  - Various types of beans include chickpeas, lentils, fava  beans, black beans, white beans   Fruits  Veggies - Large quantities of antioxidant rich fruits & veggies; 6 or more servings  - Vegetables can be eaten raw or lightly drizzled with oil and cooked  - Vegetables common to the traditional Mediterranean Diet include: artichokes, arugula, beets, broccoli, brussel sprouts, cabbage, carrots, celery, collard greens, cucumbers, eggplant, kale, leeks, lemons, lettuce, mushrooms, okra, onions, peas, peppers, potatoes, pumpkin, radishes, rutabaga, shallots, spinach, sweet potatoes, turnips, zucchini - Fruits common to the Mediterranean Diet include: apples, apricots, avocados, cherries, clementines, dates, figs, grapefruits, grapes, melons, nectarines, oranges, peaches, pears, pomegranates, strawberries, tangerines  Fats - Replace butter and margarine with healthy oils, such as olive oil, canola oil, and tahini  - Limit nuts to no more than a handful a day  - Nuts include walnuts, almonds, pecans, pistachios, pine nuts  - Limit or avoid candied, honey roasted or heavily salted nuts - Olives are central to the [employer] - can be eaten whole or used in a variety of dishes   Meats Protein - Limiting red meat: no more than a few times a month - When eating red meat: choose lean cuts and keep the portion to the size of deck of cards - Eggs: approx. 0 to 4 times a week  - Fish and lean poultry: at least 2 a week  - Healthy protein sources include, chicken, [place], lean beef, lamb - Increase intake of seafood such as tuna, salmon, trout, mackerel, shrimp, scallops - Avoid or limit high fat processed meats such as sausage and bacon  Dairy - Include moderate amounts of low fat dairy products  - Focus on healthy dairy such as fat free yogurt, skim milk, low or reduced fat cheese - Limit dairy products higher in fat such as whole or 2% milk, cheese, ice cream  Alcohol - Moderate amounts of red wine is ok  - No more than 5 oz daily for women (all ages) and  men older than age 59  - No more than 10 oz of wine daily for men younger than 62  Other - Limit sweets and other desserts  - Use herbs and spices instead of salt to flavor foods  - Herbs and spices common to the traditional Mediterranean Diet include: basil, bay leaves, chives, cloves, cumin, fennel, garlic, lavender, marjoram, mint, oregano, parsley, pepper, rosemary, sage, savory, sumac, tarragon, thyme   It's not just a diet, it's a lifestyle:  . The Mediterranean diet includes lifestyle factors typical of those in the region  . Foods, drinks and meals are best eaten with others and savored . Daily physical activity is important for overall good health . This could be strenuous exercise like running and aerobics . This could also be more leisurely activities such as walking, housework, yard-work, or taking the stairs . Moderation is the key; a balanced and healthy diet accommodates most foods and drinks . Consider portion sizes and frequency of consumption of certain foods   Meal Ideas & Options:  . Breakfast:  o Whole wheat toast or whole wheat English muffins with peanut butter & hard boiled egg o Steel cut oats topped with apples & cinnamon and skim milk  o Fresh fruit: banana, strawberries, melon, berries, peaches  o Smoothies: strawberries, bananas, greek yogurt, peanut butter o Low fat greek yogurt with blueberries and granola  o Egg white omelet with spinach and mushrooms o Breakfast couscous: whole wheat couscous, apricots, skim milk, cranberries  . Sandwiches:  o Hummus and  grilled vegetables (peppers, zucchini, squash) on whole wheat bread   o Grilled chicken on whole wheat pita with lettuce, tomatoes, cucumbers or tzatziki  o Tuna salad on whole wheat bread: tuna salad made with greek yogurt, olives, red peppers, capers, green onions o Garlic rosemary lamb pita: lamb sauted with garlic, rosemary, salt & pepper; add lettuce, cucumber, greek yogurt to pita - flavor with  lemon juice and black pepper  . Seafood:  o Mediterranean grilled salmon, seasoned with garlic, basil, parsley, lemon juice and black pepper o Shrimp, lemon, and spinach whole-grain pasta salad made with low fat greek yogurt  o Seared scallops with lemon orzo  o Seared tuna steaks seasoned salt, pepper, coriander topped with tomato mixture of olives, tomatoes, olive oil, minced garlic, parsley, green onions and cappers  . Meats:  o Herbed greek chicken salad with kalamata olives, cucumber, feta  o Red bell peppers stuffed with spinach, bulgur, lean ground beef (or lentils) & topped with feta   o Kebabs: skewers of chicken, tomatoes, onions, zucchini, squash  o [place] burgers: made with red onions, mint, dill, lemon juice, feta cheese topped with roasted red peppers . Vegetarian o Cucumber salad: cucumbers, artichoke hearts, celery, red onion, feta cheese, tossed in olive oil & lemon juice  o Hummus and whole grain pita points with a greek salad (lettuce, tomato, feta, olives, cucumbers, red onion) o Lentil soup with celery, carrots made with vegetable broth, garlic, salt and pepper  o Tabouli salad: parsley, bulgur, mint, scallions, cucumbers, tomato, radishes, lemon juice, olive oil, salt and pepper.         Standley Brooking. Tocara Mennen M.D.

## 2016-06-25 DIAGNOSIS — Z118 Encounter for screening for other infectious and parasitic diseases: Secondary | ICD-10-CM | POA: Diagnosis not present

## 2016-06-25 DIAGNOSIS — N72 Inflammatory disease of cervix uteri: Secondary | ICD-10-CM | POA: Diagnosis not present

## 2016-06-25 DIAGNOSIS — Z1159 Encounter for screening for other viral diseases: Secondary | ICD-10-CM | POA: Diagnosis not present

## 2016-10-13 ENCOUNTER — Ambulatory Visit (INDEPENDENT_AMBULATORY_CARE_PROVIDER_SITE_OTHER): Payer: 59 | Admitting: Internal Medicine

## 2016-10-13 ENCOUNTER — Encounter: Payer: Self-pay | Admitting: Internal Medicine

## 2016-10-13 VITALS — BP 100/70 | HR 84 | Temp 98.4°F | Wt 131.2 lb

## 2016-10-13 DIAGNOSIS — J040 Acute laryngitis: Secondary | ICD-10-CM

## 2016-10-13 DIAGNOSIS — R05 Cough: Secondary | ICD-10-CM

## 2016-10-13 DIAGNOSIS — J069 Acute upper respiratory infection, unspecified: Secondary | ICD-10-CM

## 2016-10-13 DIAGNOSIS — L989 Disorder of the skin and subcutaneous tissue, unspecified: Secondary | ICD-10-CM | POA: Diagnosis not present

## 2016-10-13 DIAGNOSIS — R059 Cough, unspecified: Secondary | ICD-10-CM

## 2016-10-13 MED ORDER — DOXYCYCLINE HYCLATE 100 MG PO TABS: 100.0000 mg | ORAL_TABLET | Freq: Two times a day (BID) | ORAL | 0 refills | Status: DC

## 2016-10-13 NOTE — Patient Instructions (Addendum)
Chest exam is reassuring but since getting worse at the 14 day mark  Consider adding antibiotic  With fluids  Also nasal saline and  Nasal cortisone to help with post nasal drainage   Taking every day such as nasacort or flonase .   Expect improvement in the next  3-5 day  But airway can beirritable for a while   Stay hydrated .     Laryngitis Laryngitis is inflammation of your vocal cords. This causes hoarseness, coughing, loss of voice, sore throat, or a dry throat. Your vocal cords are two bands of muscles that are found in your throat. When you speak, these cords come together and vibrate. These vibrations come out through your mouth as sound. When your vocal cords are inflamed, your voice sounds different. Laryngitis can be temporary (acute) or long-term (chronic). Most cases of acute laryngitis improve with time. Chronic laryngitis is laryngitis that lasts for more than three weeks. What are the causes? Acute laryngitis may be caused by:  A viral infection.  Lots of talking, yelling, or singing. This is also called vocal strain.  Bacterial infections.  Chronic laryngitis may be caused by:  Vocal strain.  Injury to your vocal cords.  Acid reflux (gastroesophageal reflux disease or GERD).  Allergies.  Sinus infection.  Smoking.  Alcohol abuse.  Breathing in chemicals or dust.  Growths on the vocal cords.  What increases the risk? Risk factors for laryngitis include:  Smoking.  Alcohol abuse.  Having allergies.  What are the signs or symptoms? Symptoms of laryngitis may include:  Low, hoarse voice.  Loss of voice.  Dry cough.  Sore throat.  Stuffy nose.  How is this diagnosed? Laryngitis may be diagnosed by:  Physical exam.  Throat culture.  Blood test.  Laryngoscopy. This procedure allows your health care provider to look at your vocal cords with a mirror or viewing tube.  How is this treated? Treatment for laryngitis depends on what is  causing it. Usually, treatment involves resting your voice and using medicines to soothe your throat. However, if your laryngitis is caused by a bacterial infection, you may need to take antibiotic medicine. If your laryngitis is caused by a growth, you may need to have a procedure to remove it. Follow these instructions at home:  Drink enough fluid to keep your urine clear or pale yellow.  Breathe in moist air. Use a humidifier if you live in a dry climate.  Take medicines only as directed by your health care provider.  If you were prescribed an antibiotic medicine, finish it all even if you start to feel better.  Do not smoke cigarettes or electronic cigarettes. If you need help quitting, ask your health care provider.  Talk as little as possible. Also avoid whispering, which can cause vocal strain.  Write instead of talking. Do this until your voice is back to normal. Contact a health care provider if:  You have a fever.  You have increasing pain.  You have difficulty swallowing. Get help right away if:  You cough up blood.  You have trouble breathing. This information is not intended to replace advice given to you by your health care provider. Make sure you discuss any questions you have with your health care provider. Document Released: 04/14/2005 Document Revised: 09/20/2015 Document Reviewed: 09/27/2013 [employer] Interactive Patient Education  [employer]2018 [employer]

## 2016-10-13 NOTE — Progress Notes (Signed)
Chief Complaint  Patient presents with  . Cough    HPI: [name] 41 y.o.  sda    Here with 40 yo duaghter who alos has had a cough for 2 -3 weeks weeks  Onset like ur congestion and cough but no fever  And face pain but persistent worsening   Mucous thick green  Without hemoptyusis  And no sob   But very hoarse today ,.  No face pain   Going out of town end of week .  ROS: See pertinent positives and negatives per HPI. No nv d rash not usually prolong sx like this  Using otc sl  Has itchy bump getting bigger left ring finger palm surface   Past Medical History:  Diagnosis Date  . Allergy     Family History  Problem Relation Age of Onset  . Breast cancer Maternal Grandmother   . Hyperlipidemia Brother        [employer]  . Hyperlipidemia Brother        Little Brother  . Hypertension Brother        also blocked artery ? opened up.    Social History   Social History  . Marital status: Married    Spouse name: N/A  . Number of children: N/A  . Years of education: N/A   Social History Main Topics  . Smoking status: Never Smoker  . Smokeless tobacco: Never Used  . Alcohol use 0.0 oz/week  . Drug use: No  . Sexual activity: Not Asked   Other Topics Concern  . 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   8+ hours of sleep per night   1 daughter (six years of age) hh of 3       Neg td  ocass etoh    Exercise .    Outpatient Medications Prior to Visit  Medication Sig Dispense Refill  . JUNEL FE 1/20 1-20 MG-MCG tablet      No facility-administered medications prior to visit.      EXAM:  BP 100/70 (BP Location: Right Arm, Patient Position: Sitting, Cuff Size: Normal)   Pulse 84   Temp 98.4 F (36.9 C) (Oral)   Wt 131 lb 3.2 oz (59.5 kg)   SpO2 98%   BMI 19.37 kg/m   Body mass index is 19.37 kg/m. WDWN in NAD  quiet respirations; mildly congested   somewhat 2+ hoarse. Non toxic . HEENT: Normocephalic ;atraumatic , Eyes;  PERRL, EOMs  Full, lids and conjunctiva clear,,Ears: no deformities, canals nl, TM landmarks normal, Nose: no deformity or discharge but congested;face minimally tender Mouth : OP clear without lesion or edema . Neck: Supple without adenopathy or masses or bruits Chest:  Clear to A&P without wheezes rales or rhonchi CV:  S1-S2 no gallops or murmurs peripheral perfusion is normal Skin :nl perfusion and no acute rashes  Left ring 2 mm white smooth papule noted     ASSESSMENT AND PLAN:  Discussed the following assessment and plan:  Cough  Protracted URI - poss sinusitis  Laryngitis  Skin lesion Primary illness prob viral poss allergy but worsening after 2 weeks with assessment  Risk benefiit   Of antibiotic use of sinusitis etc.  Follow  Skin lesion  ? Atypical wart  Not looking alarming   Watch for now  Measure discussed  Empiric antibiotic  -Patient advised to return  or notify health care team  if symptoms worsen ,persist or new concerns arise.  Patient Instructions  Chest exam is reassuring but since getting worse at the 14 day mark  Consider adding antibiotic  With fluids  Also nasal saline and  Nasal cortisone to help with post nasal drainage   Taking every day such as nasacort or flonase .   Expect improvement in the next  3-5 day  But airway can beirritable for a while   Stay hydrated .     Laryngitis Laryngitis is inflammation of your vocal cords. This causes hoarseness, coughing, loss of voice, sore throat, or a dry throat. Your vocal cords are two bands of muscles that are found in your throat. When you speak, these cords come together and vibrate. These vibrations come out through your mouth as sound. When your vocal cords are inflamed, your voice sounds different. Laryngitis can be temporary (acute) or long-term (chronic). Most cases of acute laryngitis improve with time. Chronic laryngitis is  laryngitis that lasts for more than three weeks. What are the causes? Acute laryngitis may be caused by:  A viral infection.  Lots of talking, yelling, or singing. This is also called vocal strain.  Bacterial infections.  Chronic laryngitis may be caused by:  Vocal strain.  Injury to your vocal cords.  Acid reflux (gastroesophageal reflux disease or GERD).  Allergies.  Sinus infection.  Smoking.  Alcohol abuse.  Breathing in chemicals or dust.  Growths on the vocal cords.  What increases the risk? Risk factors for laryngitis include:  Smoking.  Alcohol abuse.  Having allergies.  What are the signs or symptoms? Symptoms of laryngitis may include:  Low, hoarse voice.  Loss of voice.  Dry cough.  Sore throat.  Stuffy nose.  How is this diagnosed? Laryngitis may be diagnosed by:  Physical exam.  Throat culture.  Blood test.  Laryngoscopy. This procedure allows your health care provider to look at your vocal cords with a mirror or viewing tube.  How is this treated? Treatment for laryngitis depends on what is causing it. Usually, treatment involves resting your voice and using medicines to soothe your throat. However, if your laryngitis is caused by a bacterial infection, you may need to take antibiotic medicine. If your laryngitis is caused by a growth, you may need to have a procedure to remove it. Follow these instructions at home:  Drink enough fluid to keep your urine clear or pale yellow.  Breathe in moist air. Use a humidifier if you live in a dry climate.  Take medicines only as directed by your health care provider.  If you were prescribed an antibiotic medicine, finish it all even if you start to feel better.  Do not smoke cigarettes or electronic cigarettes. If you need help quitting, ask your health care provider.  Talk as little as possible. Also avoid whispering, which can cause vocal strain.  Write instead of talking. Do this  until your voice is back to normal. Contact a health care provider if:  You have a fever.  You have increasing pain.  You have difficulty swallowing. Get help right away if:  You cough up blood.  You have trouble breathing. This information is not intended to replace advice given to you by your health care provider. Make sure you discuss any questions you have with your health care provider. Document Released: 04/14/2005 Document Revised: 09/20/2015 Document Reviewed: 09/27/2013 Elsevier Interactive Patient Education  [employer]2018 Elsevier Inc.  Standley Brooking. Kaneshia Cater M.D.

## 2016-10-23 NOTE — Progress Notes (Signed)
Chief Complaint  Patient presents with  . Annual Exam    HPI: Patient  [name]  41 y.o. comes in today for [place] visit  sicne last visit   Laryngitis better  Had episode after not eating all day and then had a mixer  With sever ab upper abd paina nd then into her chest that lasted a few hours   Worse with laying down and went away  . Had vomit xc 1  Only ever had episode x 1 in the past similar situation  No cp sob hb cough now .  Felt sob and badly but then away and no other  Not related to exercise activity but to not eaing all day.   Has lesion finger  X months  Slowly getting bigger   Health Maintenance  Topic Date Due  . HIV Screening  10/27/2017 (Originally 10/29/1990)  . INFLUENZA VACCINE  11/26/2016  . PAP SMEAR  02/26/2017  . TETANUS/TDAP  05/26/2017   Health Maintenance Review LIFESTYLE:  Exercise:   y zumba  Swim Y  Tobacco/ETS: no Alcohol:  ocass Sugar beverages: not daily  Sleep: at least 8 hours  Drug use: no HH of  3  Pets  None  Work: 45 50     ROS:  See above   Sees gyne on reg basis  GEN/ HEENT: No fever, significant weight changes sweats headaches vision problems hearing changes, CV/ PULM; No chest pain shortness of breath cough, syncope,edema  change in exercise tolerance. GI /GU: No adominal pain, vomiting, change in bowel habits. No blood in the stool. No significant GU symptoms. SKIN/HEME: ,no acute skin rashes suspicious lesions or bleeding. No lymphadenopathy, nodules, masses.  NEURO/ PSYCH:  No neurologic signs such as weakness numbness. No depression anxiety. IMM/ Allergy: No unusual infections.  Allergy .   REST of 12 system review negative except as per HPI   Past Medical History:  Diagnosis Date  . Allergy     Past Surgical History:  Procedure Laterality Date  . MOLE REMOVAL      Family History  Problem Relation Age of Onset  . Breast cancer Maternal Grandmother   . Hyperlipidemia Brother        [employer]  . Hyperlipidemia Brother        Little Brother  . Hypertension Brother        also blocked artery ? opened up.    Social History   Social History  . Marital status: Married    Spouse name: N/A  . Number of children: N/A  . Years of education: N/A   Social History Main Topics  . Smoking status: Never Smoker  . Smokeless tobacco: Never Used  . Alcohol use 0.0 oz/week     Comment: ocass  . Drug use: No  . Sexual activity: Not Asked   Other Topics Concern  . 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   8+ hours of sleep per night   1 daughter (six years of age) hh of 3       Neg td  ocass etoh    Exercise .    Outpatient Medications Prior to Visit  Medication Sig Dispense Refill  . JUNEL FE 1/20 1-20 MG-MCG tablet     . doxycycline (VIBRA-TABS) 100 MG tablet Take 1 tablet (100 mg total) by mouth 2 (two)  times daily. 14 tablet 0   No facility-administered medications prior to visit.      EXAM:  BP 100/70 (BP Location: Right Arm, Patient Position: Sitting, Cuff Size: Normal)   Pulse 85   Temp 98.2 F (36.8 C) (Oral)   Ht _0  (1.753 m)   Wt 129 lb 14.4 oz (58.9 kg)   BMI 19.18 kg/m   Body mass index is 19.18 kg/m. Wt Readings from Last 3 Encounters:  10/27/16 129 lb 14.4 oz (58.9 kg)  10/13/16 131 lb 3.2 oz (59.5 kg)  11/22/14 128 lb 6.4 oz (58.2 kg)    Physical Exam: Vital signs reviewed [id number]:JFHL is a well-developed well-nourished alert cooperative    who appearsr stated age in no acute distress.  HEENT: normocephalic atraumatic , Eyes: PERRL EOM's full, conjunctiva clear, Nares: paten,t no deformity discharge or tenderness., Ears: no deformity EAC's clear TMs with normal landmarks. Mouth: clear OP, no lesions, edema.  Moist mucous membranes. Dentition in adequate repair. NECK: supple without masses, thyromegaly or bruits. CHEST/PULM:  Clear to  auscultation and percussion breath sounds equal no wheeze , rales or rhonchi. No chest wall deformities or tenderness. Breast: normal by inspection . No dimpling, discharge, masses, tenderness or discharge . CV: PMI is nondisplaced, S1 S2 no gallops, murmurs, rubs. Peripheral pulses are full without delay.No JVD .  ABDOMEN: Bowel sounds normal nontender  No guard or rebound, no hepato splenomegal no CVA tenderness.  No hernia. Extremtities:  No clubbing cyanosis or edema, no acute joint swelling or redness no focal atrophy NEURO:  Oriented x3, cranial nerves 3-12 appear to be intact, no obvious focal weakness,gait within normal limits no abnormal reflexes or asymmetrical SKIN: No acute rashes normal turgor, color, no bruising or petechiae.   Left ring finger  palpmar surgface 53m umbilicated cnetral white  moccluscum like lesion single  PSYCH: Oriented, good eye contact, no obvious depression anxiety, cognition and judgment appear normal. LN: no cervical axillary inguinal adenopathy  Lab Results  Component Value Date   WBC 6.3 11/22/2014   HGB 13.7 11/22/2014   HCT 40.3 11/22/2014   PLT 169.0 11/22/2014   GLUCOSE 76 11/22/2014   CHOL 151 11/22/2014   TRIG 85.0 11/22/2014   HDL 59.40 11/22/2014   LDLCALC 75 11/22/2014   ALT 11 11/22/2014   AST 13 11/22/2014   NA 138 11/22/2014   K 3.5 11/22/2014   CL 104 11/22/2014   CREATININE 0.76 11/22/2014   BUN 12 11/22/2014   CO2 27 11/22/2014   TSH 1.60 11/22/2014    BP Readings from Last 3 Encounters:  10/27/16 100/70  10/13/16 100/70  11/22/14 116/80    Lab results reviewed with patient   ASSESSMENT AND PLAN:  Discussed the following assessment and plan:  Visit for preventive health examination - Plan: Basic metabolic panel, CBC with Differential/Platelet, Hepatic function panel, Lipid panel  History of chest pain - sound esophageal fu if returning  recurrent  . - Plan: Basic metabolic panel, CBC with Differential/Platelet,  Hepatic function panel, Lipid panel  Mollusca contagiosa - rx liquid n2 today  - Plan: Basic metabolic panel, CBC with Differential/Platelet, Hepatic function panel, Lipid panel Disc risk benefit or rx for molluscum  Pt wanted  to proceed with freeze.     Liquid n2 2 freeze cotton swab   Tolerated well   Local care     Expectant management.  Fu if needed  Patient Care Team: Panosh, WStandley Brooking MD as PCP - General (Internal  Medicine) Princess Bruins, MD as Consulting Physician (Obstetrics and Gynecology) Patient Instructions  Continue lifestyle intervention healthy eating and exercise . The lesion seems to be a molluscum .  Can be treated as a wart .   Will notify you  of labs when available.    Preventive Care 40-64 Years, Female Preventive care refers to lifestyle choices and visits with your health care provider that can promote health and wellness. What does preventive care include?  A yearly physical exam. This is also called an annual well check.  Dental exams once or twice a year.  Routine eye exams. Ask your health care provider how often you should have your eyes checked.  Personal lifestyle choices, including: ? Daily care of your teeth and gums. ? Regular physical activity. ? Eating a healthy diet. ? Avoiding tobacco and drug use. ? Limiting alcohol use. ? Practicing safe sex. ? Taking low-dose aspirin daily starting at age 33. ? Taking vitamin and mineral supplements as recommended by your health care provider. What happens during an annual well check? The services and screenings done by your health care provider during your annual well check will depend on your age, overall health, lifestyle risk factors, and family history of disease. Counseling Your health care provider may ask you questions about your:  Alcohol use.  Tobacco use.  Drug use.  Emotional well-being.  Home and relationship well-being.  Sexual activity.  Eating habits.  Work and work  Statistician.  Method of birth control.  Menstrual cycle.  Pregnancy history.  Screening You may have the following tests or measurements:  Height, weight, and BMI.  Blood pressure.  Lipid and cholesterol levels. These may be checked every 5 years, or more frequently if you are over 9 years old.  Skin check.  Lung cancer screening. You may have this screening every year starting at age 9 if you have a 30-pack-year history of smoking and currently smoke or have quit within the past 15 years.  Fecal occult blood test (FOBT) of the stool. You may have this test every year starting at age 28.  Flexible sigmoidoscopy or colonoscopy. You may have a sigmoidoscopy every 5 years or a colonoscopy every 10 years starting at age 12.  Hepatitis C blood test.  Hepatitis B blood test.  Sexually transmitted disease (STD) testing.  Diabetes screening. This is done by checking your blood sugar (glucose) after you have not eaten for a while (fasting). You may have this done every 1-3 years.  Mammogram. This may be done every 1-2 years. Talk to your health care provider about when you should start having regular mammograms. This may depend on whether you have a family history of breast cancer.  BRCA-related cancer screening. This may be done if you have a family history of breast, ovarian, tubal, or peritoneal cancers.  Pelvic exam and Pap test. This may be done every 3 years starting at age 85. Starting at age 58, this may be done every 5 years if you have a Pap test in combination with an HPV test.  Bone density scan. This is done to screen for osteoporosis. You may have this scan if you are at high risk for osteoporosis.  Discuss your test results, treatment options, and if necessary, the need for more tests with your health care provider. Vaccines Your health care provider may recommend certain vaccines, such as:  Influenza vaccine. This is recommended every year.  Tetanus,  diphtheria, and acellular pertussis (Tdap, Td) vaccine. You may  need a Td booster every 10 years.  Varicella vaccine. You may need this if you have not been vaccinated.  Zoster vaccine. You may need this after age 38.  Measles, mumps, and rubella (MMR) vaccine. You may need at least one dose of MMR if you were born in 1957 or later. You may also need a second dose.  Pneumococcal 13-valent conjugate (PCV13) vaccine. You may need this if you have certain conditions and were not previously vaccinated.  Pneumococcal polysaccharide (PPSV23) vaccine. You may need one or two doses if you smoke cigarettes or if you have certain conditions.  Meningococcal vaccine. You may need this if you have certain conditions.  Hepatitis A vaccine. You may need this if you have certain conditions or if you travel or work in places where you may be exposed to hepatitis A.  Hepatitis B vaccine. You may need this if you have certain conditions or if you travel or work in places where you may be exposed to hepatitis B.  Haemophilus influenzae type b (Hib) vaccine. You may need this if you have certain conditions.  Talk to your health care provider about which screenings and vaccines you need and how often you need them. This information is not intended to replace advice given to you by your health care provider. Make sure you discuss any questions you have with your health care provider. Document Released: 05/11/2015 Document Revised: 01/02/2016 Document Reviewed: 02/13/2015 Elsevier Interactive Patient Education  2017 [place] K. Panosh M.D.

## 2016-10-27 ENCOUNTER — Ambulatory Visit (INDEPENDENT_AMBULATORY_CARE_PROVIDER_SITE_OTHER): Payer: 59 | Admitting: Internal Medicine

## 2016-10-27 ENCOUNTER — Encounter: Payer: Self-pay | Admitting: Internal Medicine

## 2016-10-27 VITALS — BP 100/70 | HR 85 | Temp 98.2°F | Ht 69.0 in | Wt 129.9 lb

## 2016-10-27 DIAGNOSIS — B081 Molluscum contagiosum: Secondary | ICD-10-CM

## 2016-10-27 DIAGNOSIS — Z0000 Encounter for general adult medical examination without abnormal findings: Secondary | ICD-10-CM

## 2016-10-27 DIAGNOSIS — Z0001 Encounter for general adult medical examination with abnormal findings: Secondary | ICD-10-CM | POA: Diagnosis not present

## 2016-10-27 DIAGNOSIS — Z87898 Personal history of other specified conditions: Secondary | ICD-10-CM | POA: Diagnosis not present

## 2016-10-27 LAB — CBC WITH DIFFERENTIAL/PLATELET
BASOS PCT: 0.4 % (ref 0.0–3.0)
Basophils Absolute: 0 10*3/uL (ref 0.0–0.1)
EOS PCT: 3.1 % (ref 0.0–5.0)
Eosinophils Absolute: 0.2 10*3/uL (ref 0.0–0.7)
HCT: 41.8 % (ref 36.0–46.0)
Hemoglobin: 14.1 g/dL (ref 12.0–15.0)
LYMPHS ABS: 1.8 10*3/uL (ref 0.7–4.0)
Lymphocytes Relative: 34.4 % (ref 12.0–46.0)
MCHC: 33.7 g/dL (ref 30.0–36.0)
MCV: 92.5 fl (ref 78.0–100.0)
MONO ABS: 0.3 10*3/uL (ref 0.1–1.0)
Monocytes Relative: 5.8 % (ref 3.0–12.0)
NEUTROS ABS: 2.9 10*3/uL (ref 1.4–7.7)
NEUTROS PCT: 56.3 % (ref 43.0–77.0)
PLATELETS: 172 10*3/uL (ref 150.0–400.0)
RBC: 4.51 Mil/uL (ref 3.87–5.11)
RDW: 13.6 % (ref 11.5–15.5)
WBC: 5.2 10*3/uL (ref 4.0–10.5)

## 2016-10-27 LAB — HEPATIC FUNCTION PANEL
ALT: 20 U/L (ref 0–35)
AST: 23 U/L (ref 0–37)
Albumin: 4.1 g/dL (ref 3.5–5.2)
Alkaline Phosphatase: 42 U/L (ref 39–117)
Bilirubin, Direct: 0.1 mg/dL (ref 0.0–0.3)
Total Bilirubin: 0.7 mg/dL (ref 0.2–1.2)
Total Protein: 7 g/dL (ref 6.0–8.3)

## 2016-10-27 LAB — BASIC METABOLIC PANEL
BUN: 11 mg/dL (ref 6–23)
CO2: 25 mEq/L (ref 19–32)
Calcium: 9.7 mg/dL (ref 8.4–10.5)
Chloride: 103 mEq/L (ref 96–112)
Creatinine, Ser: 0.77 mg/dL (ref 0.40–1.20)
GFR: 88.25 mL/min (ref >60.00)
GLUCOSE: 85 mg/dL (ref 70–99)
POTASSIUM: 3.8 meq/L (ref 3.5–5.1)
Sodium: 138 mEq/L (ref 135–145)

## 2016-10-27 LAB — LIPID PANEL
CHOLESTEROL: 166 mg/dL (ref 0–200)
HDL: 65.1 mg/dL (ref >39.00)
LDL Cholesterol: 83 mg/dL (ref 0–99)
NonHDL: 101.09
TRIGLYCERIDES: 90 mg/dL (ref 0.0–149.0)
Total CHOL/HDL Ratio: 3
VLDL: 18 mg/dL (ref 0.0–40.0)

## 2016-10-27 NOTE — Patient Instructions (Signed)
Continue lifestyle intervention healthy eating and exercise . The lesion seems to be a molluscum .  Can be treated as a wart .   Will notify you  of labs when available.    Preventive Care 40-64 Years, Female Preventive care refers to lifestyle choices and visits with your health care provider that can promote health and wellness. What does preventive care include?  A yearly physical exam. This is also called an annual well check.  Dental exams once or twice a year.  Routine eye exams. Ask your health care provider how often you should have your eyes checked.  Personal lifestyle choices, including: ? Daily care of your teeth and gums. ? Regular physical activity. ? Eating a healthy diet. ? Avoiding tobacco and drug use. ? Limiting alcohol use. ? Practicing safe sex. ? Taking low-dose aspirin daily starting at age 51. ? Taking vitamin and mineral supplements as recommended by your health care provider. What happens during an annual well check? The services and screenings done by your health care provider during your annual well check will depend on your age, overall health, lifestyle risk factors, and family history of disease. Counseling Your health care provider may ask you questions about your:  Alcohol use.  Tobacco use.  Drug use.  Emotional well-being.  Home and relationship well-being.  Sexual activity.  Eating habits.  Work and work Statistician.  Method of birth control.  Menstrual cycle.  Pregnancy history.  Screening You may have the following tests or measurements:  Height, weight, and BMI.  Blood pressure.  Lipid and cholesterol levels. These may be checked every 5 years, or more frequently if you are over 45 years old.  Skin check.  Lung cancer screening. You may have this screening every year starting at age 85 if you have a 30-pack-year history of smoking and currently smoke or have quit within the past 15 years.  Fecal occult blood test  (FOBT) of the stool. You may have this test every year starting at age 32.  Flexible sigmoidoscopy or colonoscopy. You may have a sigmoidoscopy every 5 years or a colonoscopy every 10 years starting at age 72.  Hepatitis C blood test.  Hepatitis B blood test.  Sexually transmitted disease (STD) testing.  Diabetes screening. This is done by checking your blood sugar (glucose) after you have not eaten for a while (fasting). You may have this done every 1-3 years.  Mammogram. This may be done every 1-2 years. Talk to your health care provider about when you should start having regular mammograms. This may depend on whether you have a family history of breast cancer.  BRCA-related cancer screening. This may be done if you have a family history of breast, ovarian, tubal, or peritoneal cancers.  Pelvic exam and Pap test. This may be done every 3 years starting at age 70. Starting at age 18, this may be done every 5 years if you have a Pap test in combination with an HPV test.  Bone density scan. This is done to screen for osteoporosis. You may have this scan if you are at high risk for osteoporosis.  Discuss your test results, treatment options, and if necessary, the need for more tests with your health care provider. Vaccines Your health care provider may recommend certain vaccines, such as:  Influenza vaccine. This is recommended every year.  Tetanus, diphtheria, and acellular pertussis (Tdap, Td) vaccine. You may need a Td booster every 10 years.  Varicella vaccine. You may need this  if you have not been vaccinated.  Zoster vaccine. You may need this after age 34.  Measles, mumps, and rubella (MMR) vaccine. You may need at least one dose of MMR if you were born in 1957 or later. You may also need a second dose.  Pneumococcal 13-valent conjugate (PCV13) vaccine. You may need this if you have certain conditions and were not previously vaccinated.  Pneumococcal polysaccharide (PPSV23)  vaccine. You may need one or two doses if you smoke cigarettes or if you have certain conditions.  Meningococcal vaccine. You may need this if you have certain conditions.  Hepatitis A vaccine. You may need this if you have certain conditions or if you travel or work in places where you may be exposed to hepatitis A.  Hepatitis B vaccine. You may need this if you have certain conditions or if you travel or work in places where you may be exposed to hepatitis B.  Haemophilus influenzae type b (Hib) vaccine. You may need this if you have certain conditions.  Talk to your health care provider about which screenings and vaccines you need and how often you need them. This information is not intended to replace advice given to you by your health care provider. Make sure you discuss any questions you have with your health care provider. Document Released: 05/11/2015 Document Revised: 01/02/2016 Document Reviewed: 02/13/2015 Elsevier Interactive Patient Education  2017 [employer].

## 2016-12-23 DIAGNOSIS — Z1231 Encounter for screening mammogram for malignant neoplasm of breast: Secondary | ICD-10-CM | POA: Diagnosis not present

## 2017-04-30 DIAGNOSIS — Z23 Encounter for immunization: Secondary | ICD-10-CM | POA: Diagnosis not present

## 2017-04-30 DIAGNOSIS — Z01419 Encounter for gynecological examination (general) (routine) without abnormal findings: Secondary | ICD-10-CM | POA: Diagnosis not present

## 2017-12-17 NOTE — Progress Notes (Signed)
Chief Complaint  Patient presents with  . Annual Exam    GI issue - pt never eats fried foods and the last two times she has eatten it she experienced severe bloating, pressure below sternum, SOB and Nausea / vomiting. Pt reports after vomiting she felt fine. //  Right knee pain/discomfort, pain comes and goes. No noted swelling.    HPI: Patient  [name]  42 y.o. comes in today for Preventive Health Care visit  And above    See above had serious abd pain episode    Earlier in evening had eating fried  Meal okra french fries and chicken.  Sudden onset mid abd pain    And  Hard abdomen   And woke her up  And ?  Heartburns?  Used nexium .   And ginger  ale.  Vomited and then felt better .   Has happened only once before  No assoc fever  Bowel changes   But  Thought to check out  . No fam hx of gb disease known    Health Maintenance  Topic Date Due  . HIV Screening  10/29/1991  . TETANUS/TDAP  05/26/2017  . INFLUENZA VACCINE  11/26/2017  . PAP SMEAR  04/28/2018 (Originally 02/26/2017)   Health Maintenance Review LIFESTYLE:  Exercise:   Walks and Y   Tobacco/ETS: no Alcohol:   ocass weekends ocass  Sugar beverages: no Sleep:  At least 8-9  Drug use: no HH of  [employer] Work: about 45  +   p week Sees gyne utd    ROS:  X see above  Left superior knee hurt after trip to beach GEN/ HEENT: No fever, significant weight changes sweats headaches vision problems hearing changes, CV/ PULM; No chest pain shortness of breath cough, syncope,edema  change in exercise tolerance. GI /GU: No adominal pain, vomiting, change in bowel habits. No blood in the stool. No significant GU symptoms. SKIN/HEME: ,no acute skin rashes suspicious lesions or bleeding. No lymphadenopathy, nodules, masses.  NEURO/ PSYCH:  No neurologic signs such as weakness numbness. No depression anxiety. IMM/ Allergy: No unusual infections.  Allergy .   REST of 12 system review negative except as per HPI   Past  Medical History:  Diagnosis Date  . Allergy     Past Surgical History:  Procedure Laterality Date  . MOLE REMOVAL      Family History  Problem Relation Age of Onset  . Breast cancer Maternal Grandmother   . Hyperlipidemia Brother        [employer]  . Hyperlipidemia Brother        Little Brother  . Hypertension Brother        also blocked artery ? opened up.    Social History   Socioeconomic History  . Marital status: Married    Spouse name: Not on file  . Number of children: Not on file  . Years of education: Not on file  . Highest education level: Not on file  Occupational History  . Not on file  Social Needs  . Financial resource strain: Not on file  . Food insecurity:    Worry: Not on file    Inability: Not on file  . Transportation needs:    Medical: Not on file    Non-medical: Not on file  Tobacco Use  . Smoking status: Never Smoker  . Smokeless tobacco: Never Used  Substance and Sexual Activity  . Alcohol use: Yes    Alcohol/week: 0.0  standard drinks    Comment: ocass  . Drug use: No  . Sexual activity: Not on file  Lifestyle  . Physical activity:    Days per week: Not on file    Minutes per session: Not on file  . Stress: Not on file  Relationships  . Social connections:    Talks on phone: Not on file    Gets together: Not on file    Attends religious service: Not on file    Active member of club or organization: Not on file    Attends meetings of clubs or organizations: Not on file    Relationship status: Not on file  Other Topics Concern  . 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   8+ hours of sleep per night   1 daughter (six years of age) hh of 3       Neg td  ocass etoh    Exercise .    Outpatient Medications Prior to Visit  Medication Sig Dispense Refill  . JUNEL FE 1/20 1-20 MG-MCG tablet     . folic acid (FOLVITE) 1 MG tablet  Take 1 mg by mouth daily.  3   No facility-administered medications prior to visit.      EXAM:  BP 98/60 (BP Location: Right Arm, Patient Position: Sitting, Cuff Size: Normal)   Pulse 99   Temp 98.1 F (36.7 C) (Oral)   Ht 5' 8.75" (1.746 m)   Wt 133 lb 4.8 oz (60.5 kg)   BMI 19.83 kg/m   Body mass index is 19.83 kg/m. Wt Readings from Last 3 Encounters:  12/18/17 133 lb 4.8 oz (60.5 kg)  10/27/16 129 lb 14.4 oz (58.9 kg)  10/13/16 131 lb 3.2 oz (59.5 kg)    Physical Exam: Vital signs reviewed [id number]:[id number] is a well-developed well-nourished alert cooperative    who appearsr stated age in no acute distress.  HEENT: normocephalic atraumatic , Eyes: PERRL EOM's full, conjunctiva clear, Nares: paten,t no deformity discharge or tenderness., Ears: no deformity EAC's clear TMs with normal landmarks. Mouth: clear OP, no lesions, edema.  Moist mucous membranes. Dentition in adequate repair. NECK: supple without masses, thyromegaly or bruits. CHEST/PULM:  Clear to auscultation and percussion breath sounds equal no wheeze , rales or rhonchi. No chest wall deformities or tenderness. Breast: normal by inspection . No dimpling, discharge, masses, tenderness or discharge . CV: PMI is nondisplaced, S1 S2 no gallops, murmurs, rubs. Peripheral pulses are full without delay.No JVD .  ABDOMEN: Bowel sounds normal nontender  No guard or rebound, no hepato splenomegal no CVA tenderness.  No hernia. Extremtities:  No clubbing cyanosis or edema, no acute joint swelling or redness no focal atrophy NEURO:  Oriented x3, cranial nerves 3-12 appear to be intact, no obvious focal weakness,gait within normal limits no abnormal reflexes or asymmetrical SKIN: No acute rashes normal turgor, color, no bruising or petechiae. PSYCH: Oriented, good eye contact, no obvious depression anxiety, cognition and judgment appear normal. LN: no cervical axillary inguinal adenopathy  Lab Results  Component Value Date   WBC  5.3 12/18/2017   HGB 14.5 12/18/2017   HCT 42.3 12/18/2017   PLT 173.0 12/18/2017   GLUCOSE 99 12/18/2017   CHOL 167 12/18/2017   TRIG 140.0 12/18/2017   HDL 66.00 12/18/2017   LDLCALC 73 12/18/2017   ALT 11 12/18/2017   AST 13 12/18/2017  NA 140 12/18/2017   K 3.7 12/18/2017   CL 104 12/18/2017   CREATININE 0.79 12/18/2017   BUN 11 12/18/2017   CO2 29 12/18/2017   TSH 1.30 12/18/2017    BP Readings from Last 3 Encounters:  12/18/17 98/60  10/27/16 100/70  10/13/16 100/70    Wt Readings from Last 3 Encounters:  12/18/17 133 lb 4.8 oz (60.5 kg)  10/27/16 129 lb 14.4 oz (58.9 kg)  10/13/16 131 lb 3.2 oz (59.5 kg)     ASSESSMENT AND PLAN:  Discussed the following assessment and plan:  Visit for preventive health examination - Plan: Basic metabolic panel, CBC with Differential/Platelet, Hepatic function panel, Lipid panel, TSH, POCT Urinalysis Dipstick (Automated)  Screening, lipid - Plan: Basic metabolic panel, CBC with Differential/Platelet, Hepatic function panel, Lipid panel, TSH, POCT Urinalysis Dipstick (Automated)  GI symptoms - Plan: Basic metabolic panel, CBC with Differential/Platelet, Hepatic function panel, Lipid panel, TSH, POCT Urinalysis Dipstick (Automated), US Abdomen Complete  History of abdominal pain - Plan: Basic metabolic panel, CBC with Differential/Platelet, Hepatic function panel, Lipid panel, TSH, POCT Urinalysis Dipstick (Automated), US Abdomen Complete  Need for tetanus booster - Plan: Td : Tetanus/diphtheria >7yo Preservative  free  Knee pain, right anterior Abdominal pain sounds like biliary colic although no history of same and not high risk.  Exam is normal today check labs abdominal ultrasound eval as well will follow limit high risk fatty foods if recurrent plan follow-up. Knee exam looks pretty normal area in the suprapatellar area could be bursitis discussed exercise avoid deep squats ice if needed follow-up if persistent  progressive. Patient Care Team: Kenza Munar, Standley Brooking, MD as PCP - General (Internal Medicine) Princess Bruins, MD as Consulting Physician (Obstetrics and Gynecology) Patient Instructions  checking blood work and  Will be contacted about  abdominal ultrasound to check for gall stones  .   Avoid heavy fatty meals but if recurrent then   Follow up with [place].        Health Maintenance, Female Adopting a healthy lifestyle and getting preventive care can go a long way to promote health and wellness. Talk with your health care provider about what schedule of regular examinations is right for you. This is a good chance for you to check in with your provider about disease prevention and staying healthy. In between checkups, there are plenty of things you can do on your own. Experts have done a lot of research about which lifestyle changes and preventive measures are most likely to keep you healthy. Ask your health care provider for more information. Weight and diet Eat a healthy diet  Be sure to include plenty of vegetables, fruits, low-fat dairy products, and lean protein.  Do not eat a lot of foods high in solid fats, added sugars, or salt.  Get regular exercise. This is one of the most important things you can do for your health. ? Most adults should exercise for at least 150 minutes each week. The exercise should increase your heart rate and make you sweat (moderate-intensity exercise). ? Most adults should also do strengthening exercises at least twice a week. This is in addition to the moderate-intensity exercise.  Maintain a healthy weight  Body mass index (BMI) is a measurement that can be used to identify possible weight problems. It estimates body fat based on height and weight. Your health care provider can help determine your BMI and help you achieve or maintain a healthy weight.  For females 41 years of age  and older: ? A BMI below 18.5 is considered underweight. ? A BMI of 18.5 to  24.9 is normal. ? A BMI of 25 to 29.9 is considered overweight. ? A BMI of 30 and above is considered obese.  Watch levels of cholesterol and blood lipids  You should start having your blood tested for lipids and cholesterol at 41 years of age, then have this test every 5 years.  You may need to have your cholesterol levels checked more often if: ? Your lipid or cholesterol levels are high. ? You are older than 41 years of age. ? You are at high risk for heart disease.  Cancer screening Lung Cancer  Lung cancer screening is recommended for adults 51-67 years old who are at high risk for lung cancer because of a history of smoking.  A yearly low-dose CT scan of the lungs is recommended for people who: ? Currently smoke. ? Have quit within the past 15 years. ? Have at least a 30-pack-year history of smoking. A pack year is smoking an average of one pack of cigarettes a day for 1 year.  Yearly screening should continue until it has been 15 years since you quit.  Yearly screening should stop if you develop a health problem that would prevent you from having lung cancer treatment.  Breast Cancer  Practice breast self-awareness. This means understanding how your breasts normally appear and feel.  It also means doing regular breast self-exams. Let your health care provider know about any changes, no matter how small.  If you are in your 20s or 30s, you should have a clinical breast exam (CBE) by a health care provider every 1-3 years as part of a regular health exam.  If you are 58 or older, have a CBE every year. Also consider having a breast X-ray (mammogram) every year.  If you have a family history of breast cancer, talk to your health care provider about genetic screening.  If you are at high risk for breast cancer, talk to your health care provider about having an MRI and a mammogram every year.  Breast cancer gene (BRCA) assessment is recommended for women who have family  members with BRCA-related cancers. BRCA-related cancers include: ? Breast. ? Ovarian. ? Tubal. ? Peritoneal cancers.  Results of the assessment will determine the need for genetic counseling and BRCA1 and BRCA2 testing.  Cervical Cancer Your health care provider may recommend that you be screened regularly for cancer of the pelvic organs (ovaries, uterus, and vagina). This screening involves a pelvic examination, including checking for microscopic changes to the surface of your cervix (Pap test). You may be encouraged to have this screening done every 3 years, beginning at age 56.  For women ages 96-65, health care providers may recommend pelvic exams and Pap testing every 3 years, or they may recommend the Pap and pelvic exam, combined with testing for human papilloma virus (HPV), every 5 years. Some types of HPV increase your risk of cervical cancer. Testing for HPV may also be done on women of any age with unclear Pap test results.  Other health care providers may not recommend any screening for nonpregnant women who are considered low risk for pelvic cancer and who do not have symptoms. Ask your health care provider if a screening pelvic exam is right for you.  If you have had past treatment for cervical cancer or a condition that could lead to cancer, you need Pap tests and screening for cancer for  at least 20 years after your treatment. If Pap tests have been discontinued, your risk factors (such as having a new sexual partner) need to be reassessed to determine if screening should resume. Some women have medical problems that increase the chance of getting cervical cancer. In these cases, your health care provider may recommend more frequent screening and Pap tests.  Colorectal Cancer  This type of cancer can be detected and often prevented.  Routine colorectal cancer screening usually begins at 41 years of age and continues through 41 years of age.  Your health care provider may  recommend screening at an earlier age if you have risk factors for colon cancer.  Your health care provider may also recommend using home test kits to check for hidden blood in the stool.  A small camera at the end of a tube can be used to examine your colon directly (sigmoidoscopy or colonoscopy). This is done to check for the earliest forms of colorectal cancer.  Routine screening usually begins at age 32.  Direct examination of the colon should be repeated every 5-10 years through 41 years of age. However, you may need to be screened more often if early forms of precancerous polyps or small growths are found.  Skin Cancer  Check your skin from head to toe regularly.  Tell your health care provider about any new moles or changes in moles, especially if there is a change in a mole's shape or color.  Also tell your health care provider if you have a mole that is larger than the size of a pencil eraser.  Always use sunscreen. Apply sunscreen liberally and repeatedly throughout the day.  Protect yourself by wearing long sleeves, pants, a wide-brimmed hat, and sunglasses whenever you are outside.  Heart disease, diabetes, and high blood pressure  High blood pressure causes heart disease and increases the risk of stroke. High blood pressure is more likely to develop in: ? People who have blood pressure in the high end of the normal range (130-139/85-89 mm Hg). ? People who are overweight or obese. ? People who are African American.  If you are 25-16 years of age, have your blood pressure checked every 3-5 years. If you are 59 years of age or older, have your blood pressure checked every year. You should have your blood pressure measured twice-once when you are at a hospital or clinic, and once when you are not at a hospital or clinic. Record the average of the two measurements. To check your blood pressure when you are not at a hospital or clinic, you can use: ? An automated blood pressure  machine at a pharmacy. ? A home blood pressure monitor.  If you are between 42 years and 83 years old, ask your health care provider if you should take aspirin to prevent strokes.  Have regular diabetes screenings. This involves taking a blood sample to check your fasting blood sugar level. ? If you are at a normal weight and have a low risk for diabetes, have this test once every three years after 41 years of age. ? If you are overweight and have a high risk for diabetes, consider being tested at a younger age or more often. Preventing infection Hepatitis B  If you have a higher risk for hepatitis B, you should be screened for this virus. You are considered at high risk for hepatitis B if: ? You were born in a country where hepatitis B is common. Ask your health care provider  which countries are considered high risk. ? Your parents were born in a high-risk country, and you have not been immunized against hepatitis B (hepatitis B vaccine). ? You have HIV or AIDS. ? You use needles to inject street drugs. ? You live with someone who has hepatitis B. ? You have had sex with someone who has hepatitis B. ? You get hemodialysis treatment. ? You take certain medicines for conditions, including cancer, organ transplantation, and autoimmune conditions.  Hepatitis C  Blood testing is recommended for: ? Everyone born from [birth year] through 1964. ? Anyone with known risk factors for hepatitis C.  Sexually transmitted infections (STIs)  You should be screened for sexually transmitted infections (STIs) including gonorrhea and chlamydia if: ? You are sexually active and are younger than 41 years of age. ? You are older than 41 years of age and your health care provider tells you that you are at risk for this type of infection. ? Your sexual activity has changed since you were last screened and you are at an increased risk for chlamydia or gonorrhea. Ask your health care provider if you are at  risk.  If you do not have HIV, but are at risk, it may be recommended that you take a prescription medicine daily to prevent HIV infection. This is called pre-exposure prophylaxis (PrEP). You are considered at risk if: ? You are sexually active and do not regularly use condoms or know the HIV status of your partner(s). ? You take drugs by injection. ? You are sexually active with a partner who has HIV.  Talk with your health care provider about whether you are at high risk of being infected with HIV. If you choose to begin PrEP, you should first be tested for HIV. You should then be tested every 3 months for as long as you are taking PrEP. Pregnancy  If you are premenopausal and you may become pregnant, ask your health care provider about preconception counseling.  If you may become pregnant, take 400 to 800 micrograms (mcg) of folic acid every day.  If you want to prevent pregnancy, talk to your health care provider about birth control (contraception). Osteoporosis and menopause  Osteoporosis is a disease in which the bones lose minerals and strength with aging. This can result in serious bone fractures. Your risk for osteoporosis can be identified using a bone density scan.  If you are 61 years of age or older, or if you are at risk for osteoporosis and fractures, ask your health care provider if you should be screened.  Ask your health care provider whether you should take a calcium or vitamin D supplement to lower your risk for osteoporosis.  Menopause may have certain physical symptoms and risks.  Hormone replacement therapy may reduce some of these symptoms and risks. Talk to your health care provider about whether hormone replacement therapy is right for you. Follow these instructions at home:  Schedule regular health, dental, and eye exams.  Stay current with your immunizations.  Do not use any tobacco products including cigarettes, chewing tobacco, or electronic  cigarettes.  If you are pregnant, do not drink alcohol.  If you are breastfeeding, limit how much and how often you drink alcohol.  Limit alcohol intake to no more than 1 drink per day for nonpregnant women. One drink equals 12 ounces of beer, 5 ounces of wine, or 1 ounces of hard liquor.  Do not use street drugs.  Do not share needles.  Ask your  health care provider for help if you need support or information about quitting drugs.  Tell your health care provider if you often feel depressed.  Tell your health care provider if you have ever been abused or do not feel safe at home. This information is not intended to replace advice given to you by your health care provider. Make sure you discuss any questions you have with your health care provider. Document Released: 10/28/2010 Document Revised: 09/20/2015 Document Reviewed: 01/16/2015 Elsevier Interactive Patient Education  2018 [place] to knee  Avoid squats but ok to do regular activity could be a bit of bursitis / tendinitis .     Standley Brooking. Brittian Renaldo M.D.

## 2017-12-17 NOTE — Patient Instructions (Addendum)
checking blood work and  Will be contacted about  abdominal ultrasound to check for gall stones  .   Avoid heavy fatty meals but if recurrent then   Follow up with [place].        Health Maintenance, Female Adopting a healthy lifestyle and getting preventive care can go a long way to promote health and wellness. Talk with your health care provider about what schedule of regular examinations is right for you. This is a good chance for you to check in with your provider about disease prevention and staying healthy. In between checkups, there are plenty of things you can do on your own. Experts have done a lot of research about which lifestyle changes and preventive measures are most likely to keep you healthy. Ask your health care provider for more information. Weight and diet Eat a healthy diet  Be sure to include plenty of vegetables, fruits, low-fat dairy products, and lean protein.  Do not eat a lot of foods high in solid fats, added sugars, or salt.  Get regular exercise. This is one of the most important things you can do for your health. ? Most adults should exercise for at least 150 minutes each week. The exercise should increase your heart rate and make you sweat (moderate-intensity exercise). ? Most adults should also do strengthening exercises at least twice a week. This is in addition to the moderate-intensity exercise.  Maintain a healthy weight  Body mass index (BMI) is a measurement that can be used to identify possible weight problems. It estimates body fat based on height and weight. Your health care provider can help determine your BMI and help you achieve or maintain a healthy weight.  For females 7 years of age and older: ? A BMI below 18.5 is considered underweight. ? A BMI of 18.5 to 24.9 is normal. ? A BMI of 25 to 29.9 is considered overweight. ? A BMI of 30 and above is considered obese.  Watch levels of cholesterol and blood lipids  You should start having your  blood tested for lipids and cholesterol at 41 years of age, then have this test every 5 years.  You may need to have your cholesterol levels checked more often if: ? Your lipid or cholesterol levels are high. ? You are older than 42 years of age. ? You are at high risk for heart disease.  Cancer screening Lung Cancer  Lung cancer screening is recommended for adults 58-2 years old who are at high risk for lung cancer because of a history of smoking.  A yearly low-dose CT scan of the lungs is recommended for people who: ? Currently smoke. ? Have quit within the past 15 years. ? Have at least a 30-pack-year history of smoking. A pack year is smoking an average of one pack of cigarettes a day for 1 year.  Yearly screening should continue until it has been 15 years since you quit.  Yearly screening should stop if you develop a health problem that would prevent you from having lung cancer treatment.  Breast Cancer  Practice breast self-awareness. This means understanding how your breasts normally appear and feel.  It also means doing regular breast self-exams. Let your health care provider know about any changes, no matter how small.  If you are in your 20s or 30s, you should have a clinical breast exam (CBE) by a health care provider every 1-3 years as part of a regular health exam.  If you are 40  or older, have a CBE every year. Also consider having a breast X-ray (mammogram) every year.  If you have a family history of breast cancer, talk to your health care provider about genetic screening.  If you are at high risk for breast cancer, talk to your health care provider about having an MRI and a mammogram every year.  Breast cancer gene (BRCA) assessment is recommended for women who have family members with BRCA-related cancers. BRCA-related cancers include: ? Breast. ? Ovarian. ? Tubal. ? Peritoneal cancers.  Results of the assessment will determine the need for genetic  counseling and BRCA1 and BRCA2 testing.  Cervical Cancer Your health care provider may recommend that you be screened regularly for cancer of the pelvic organs (ovaries, uterus, and vagina). This screening involves a pelvic examination, including checking for microscopic changes to the surface of your cervix (Pap test). You may be encouraged to have this screening done every 3 years, beginning at age 59.  For women ages 43-65, health care providers may recommend pelvic exams and Pap testing every 3 years, or they may recommend the Pap and pelvic exam, combined with testing for human papilloma virus (HPV), every 5 years. Some types of HPV increase your risk of cervical cancer. Testing for HPV may also be done on women of any age with unclear Pap test results.  Other health care providers may not recommend any screening for nonpregnant women who are considered low risk for pelvic cancer and who do not have symptoms. Ask your health care provider if a screening pelvic exam is right for you.  If you have had past treatment for cervical cancer or a condition that could lead to cancer, you need Pap tests and screening for cancer for at least 20 years after your treatment. If Pap tests have been discontinued, your risk factors (such as having a new sexual partner) need to be reassessed to determine if screening should resume. Some women have medical problems that increase the chance of getting cervical cancer. In these cases, your health care provider may recommend more frequent screening and Pap tests.  Colorectal Cancer  This type of cancer can be detected and often prevented.  Routine colorectal cancer screening usually begins at 41 years of age and continues through 41 years of age.  Your health care provider may recommend screening at an earlier age if you have risk factors for colon cancer.  Your health care provider may also recommend using home test kits to check for hidden blood in the  stool.  A small camera at the end of a tube can be used to examine your colon directly (sigmoidoscopy or colonoscopy). This is done to check for the earliest forms of colorectal cancer.  Routine screening usually begins at age 7.  Direct examination of the colon should be repeated every 5-10 years through 41 years of age. However, you may need to be screened more often if early forms of precancerous polyps or small growths are found.  Skin Cancer  Check your skin from head to toe regularly.  Tell your health care provider about any new moles or changes in moles, especially if there is a change in a mole's shape or color.  Also tell your health care provider if you have a mole that is larger than the size of a pencil eraser.  Always use sunscreen. Apply sunscreen liberally and repeatedly throughout the day.  Protect yourself by wearing long sleeves, pants, a wide-brimmed hat, and sunglasses whenever you are  outside.  Heart disease, diabetes, and high blood pressure  High blood pressure causes heart disease and increases the risk of stroke. High blood pressure is more likely to develop in: ? People who have blood pressure in the high end of the normal range (130-139/85-89 mm Hg). ? People who are overweight or obese. ? People who are African American.  If you are 18-39 years of age, have your blood pressure checked every 3-5 years. If you are 40 years of age or older, have your blood pressure checked every year. You should have your blood pressure measured twice-once when you are at a hospital or clinic, and once when you are not at a hospital or clinic. Record the average of the two measurements. To check your blood pressure when you are not at a hospital or clinic, you can use: ? An automated blood pressure machine at a pharmacy. ? A home blood pressure monitor.  If you are between 55 years and 79 years old, ask your health care provider if you should take aspirin to prevent  strokes.  Have regular diabetes screenings. This involves taking a blood sample to check your fasting blood sugar level. ? If you are at a normal weight and have a low risk for diabetes, have this test once every three years after 41 years of age. ? If you are overweight and have a high risk for diabetes, consider being tested at a younger age or more often. Preventing infection Hepatitis B  If you have a higher risk for hepatitis B, you should be screened for this virus. You are considered at high risk for hepatitis B if: ? You were born in a country where hepatitis B is common. Ask your health care provider which countries are considered high risk. ? Your parents were born in a high-risk country, and you have not been immunized against hepatitis B (hepatitis B vaccine). ? You have HIV or AIDS. ? You use needles to inject street drugs. ? You live with someone who has hepatitis B. ? You have had sex with someone who has hepatitis B. ? You get hemodialysis treatment. ? You take certain medicines for conditions, including cancer, organ transplantation, and autoimmune conditions.  Hepatitis C  Blood testing is recommended for: ? Everyone born from 1945 through 1965. ? Anyone with known risk factors for hepatitis C.  Sexually transmitted infections (STIs)  You should be screened for sexually transmitted infections (STIs) including gonorrhea and chlamydia if: ? You are sexually active and are younger than 41 years of age. ? You are older than 41 years of age and your health care provider tells you that you are at risk for this type of infection. ? Your sexual activity has changed since you were last screened and you are at an increased risk for chlamydia or gonorrhea. Ask your health care provider if you are at risk.  If you do not have HIV, but are at risk, it may be recommended that you take a prescription medicine daily to prevent HIV infection. This is called pre-exposure prophylaxis  (PrEP). You are considered at risk if: ? You are sexually active and do not regularly use condoms or know the HIV status of your partner(s). ? You take drugs by injection. ? You are sexually active with a partner who has HIV.  Talk with your health care provider about whether you are at high risk of being infected with HIV. If you choose to begin PrEP, you should first be tested   for HIV. You should then be tested every 3 months for as long as you are taking PrEP. Pregnancy  If you are premenopausal and you may become pregnant, ask your health care provider about preconception counseling.  If you may become pregnant, take 400 to 800 micrograms (mcg) of folic acid every day.  If you want to prevent pregnancy, talk to your health care provider about birth control (contraception). Osteoporosis and menopause  Osteoporosis is a disease in which the bones lose minerals and strength with aging. This can result in serious bone fractures. Your risk for osteoporosis can be identified using a bone density scan.  If you are 17 years of age or older, or if you are at risk for osteoporosis and fractures, ask your health care provider if you should be screened.  Ask your health care provider whether you should take a calcium or vitamin D supplement to lower your risk for osteoporosis.  Menopause may have certain physical symptoms and risks.  Hormone replacement therapy may reduce some of these symptoms and risks. Talk to your health care provider about whether hormone replacement therapy is right for you. Follow these instructions at home:  Schedule regular health, dental, and eye exams.  Stay current with your immunizations.  Do not use any tobacco products including cigarettes, chewing tobacco, or electronic cigarettes.  If you are pregnant, do not drink alcohol.  If you are breastfeeding, limit how much and how often you drink alcohol.  Limit alcohol intake to no more than 1 drink per day for  nonpregnant women. One drink equals 12 ounces of beer, 5 ounces of wine, or 1 ounces of hard liquor.  Do not use street drugs.  Do not share needles.  Ask your health care provider for help if you need support or information about quitting drugs.  Tell your health care provider if you often feel depressed.  Tell your health care provider if you have ever been abused or do not feel safe at home. This information is not intended to replace advice given to you by your health care provider. Make sure you discuss any questions you have with your health care provider. Document Released: 10/28/2010 Document Revised: 09/20/2015 Document Reviewed: 01/16/2015 Elsevier Interactive Patient Education  2018 [place] to knee  Avoid squats but ok to do regular activity could be a bit of bursitis / tendinitis .

## 2017-12-18 ENCOUNTER — Encounter: Payer: Self-pay | Admitting: Internal Medicine

## 2017-12-18 ENCOUNTER — Ambulatory Visit (INDEPENDENT_AMBULATORY_CARE_PROVIDER_SITE_OTHER): Payer: 59 | Admitting: Internal Medicine

## 2017-12-18 VITALS — BP 98/60 | HR 99 | Temp 98.1°F | Ht 68.75 in | Wt 133.3 lb

## 2017-12-18 DIAGNOSIS — Z87898 Personal history of other specified conditions: Secondary | ICD-10-CM

## 2017-12-18 DIAGNOSIS — Z1231 Encounter for screening mammogram for malignant neoplasm of breast: Secondary | ICD-10-CM | POA: Diagnosis not present

## 2017-12-18 DIAGNOSIS — Z13220 Encounter for screening for lipoid disorders: Secondary | ICD-10-CM

## 2017-12-18 DIAGNOSIS — Z0000 Encounter for general adult medical examination without abnormal findings: Secondary | ICD-10-CM

## 2017-12-18 DIAGNOSIS — M25561 Pain in right knee: Secondary | ICD-10-CM

## 2017-12-18 DIAGNOSIS — Z23 Encounter for immunization: Secondary | ICD-10-CM

## 2017-12-18 DIAGNOSIS — R198 Other specified symptoms and signs involving the digestive system and abdomen: Secondary | ICD-10-CM

## 2017-12-18 LAB — TSH: TSH: 1.3 u[IU]/mL (ref 0.35–4.50)

## 2017-12-18 LAB — POC URINALSYSI DIPSTICK (AUTOMATED)
Bilirubin, UA: NEGATIVE
Glucose, UA: NEGATIVE
Ketones, UA: NEGATIVE
Nitrite, UA: NEGATIVE
PH UA: 5 (ref 5.0–8.0)
PROTEIN UA: NEGATIVE
RBC UA: NEGATIVE
Spec Grav, UA: >=1.03 — AB (ref 1.010–1.025)
Urobilinogen, UA: 0.2 E.U./dL

## 2017-12-18 LAB — LIPID PANEL
CHOLESTEROL: 167 mg/dL (ref 0–200)
HDL: 66 mg/dL (ref >39.00)
LDL Cholesterol: 73 mg/dL (ref 0–99)
NONHDL: 100.51
Total CHOL/HDL Ratio: 3
Triglycerides: 140 mg/dL (ref 0.0–149.0)
VLDL: 28 mg/dL (ref 0.0–40.0)

## 2017-12-18 LAB — HEPATIC FUNCTION PANEL
ALBUMIN: 4.4 g/dL (ref 3.5–5.2)
ALT: 11 U/L (ref 0–35)
AST: 13 U/L (ref 0–37)
Alkaline Phosphatase: 47 U/L (ref 39–117)
BILIRUBIN TOTAL: 0.8 mg/dL (ref 0.2–1.2)
Bilirubin, Direct: 0.2 mg/dL (ref 0.0–0.3)
Total Protein: 7.3 g/dL (ref 6.0–8.3)

## 2017-12-18 LAB — CBC WITH DIFFERENTIAL/PLATELET
Basophils Absolute: 0 10*3/uL (ref 0.0–0.1)
Basophils Relative: 0.4 % (ref 0.0–3.0)
EOS PCT: 4.2 % (ref 0.0–5.0)
Eosinophils Absolute: 0.2 10*3/uL (ref 0.0–0.7)
HCT: 42.3 % (ref 36.0–46.0)
HEMOGLOBIN: 14.5 g/dL (ref 12.0–15.0)
Lymphocytes Relative: 40 % (ref 12.0–46.0)
Lymphs Abs: 2.1 10*3/uL (ref 0.7–4.0)
MCHC: 34.2 g/dL (ref 30.0–36.0)
MCV: 93.1 fl (ref 78.0–100.0)
MONOS PCT: 4.9 % (ref 3.0–12.0)
Monocytes Absolute: 0.3 10*3/uL (ref 0.1–1.0)
Neutro Abs: 2.7 10*3/uL (ref 1.4–7.7)
Neutrophils Relative %: 50.5 % (ref 43.0–77.0)
Platelets: 173 10*3/uL (ref 150.0–400.0)
RBC: 4.54 Mil/uL (ref 3.87–5.11)
RDW: 14 % (ref 11.5–15.5)
WBC: 5.3 10*3/uL (ref 4.0–10.5)

## 2017-12-18 LAB — BASIC METABOLIC PANEL
BUN: 11 mg/dL (ref 6–23)
CO2: 29 mEq/L (ref 19–32)
Calcium: 9.5 mg/dL (ref 8.4–10.5)
Chloride: 104 mEq/L (ref 96–112)
Creatinine, Ser: 0.79 mg/dL (ref 0.40–1.20)
GFR: 85.18 mL/min (ref >60.00)
Glucose, Bld: 99 mg/dL (ref 70–99)
POTASSIUM: 3.7 meq/L (ref 3.5–5.1)
SODIUM: 140 meq/L (ref 135–145)

## 2018-01-19 ENCOUNTER — Ambulatory Visit
Admission: RE | Admit: 2018-01-19 | Discharge: 2018-01-19 | Disposition: A | Discharge status: Home / Self Care | Payer: 59 | Source: Ambulatory Visit | Attending: Internal Medicine | Admitting: Internal Medicine

## 2018-01-19 DIAGNOSIS — R109 Unspecified abdominal pain: Secondary | ICD-10-CM | POA: Diagnosis not present

## 2018-01-19 DIAGNOSIS — Z87898 Personal history of other specified conditions: Secondary | ICD-10-CM

## 2018-01-19 DIAGNOSIS — R198 Other specified symptoms and signs involving the digestive system and abdomen: Secondary | ICD-10-CM

## 2018-11-04 ENCOUNTER — Ambulatory Visit (INDEPENDENT_AMBULATORY_CARE_PROVIDER_SITE_OTHER): Payer: BC Managed Care – PPO | Admitting: Family Medicine

## 2018-11-04 ENCOUNTER — Ambulatory Visit: Payer: Self-pay

## 2018-11-04 ENCOUNTER — Other Ambulatory Visit: Payer: Self-pay

## 2018-11-04 ENCOUNTER — Encounter: Payer: Self-pay | Admitting: Family Medicine

## 2018-11-04 VITALS — BP 128/88 | Ht 70.0 in | Wt 135.0 lb

## 2018-11-04 DIAGNOSIS — M25562 Pain in left knee: Secondary | ICD-10-CM

## 2018-11-04 DIAGNOSIS — M222X2 Patellofemoral disorders, left knee: Secondary | ICD-10-CM

## 2018-11-04 DIAGNOSIS — M25561 Pain in right knee: Secondary | ICD-10-CM

## 2018-11-04 DIAGNOSIS — M222X1 Patellofemoral disorders, right knee: Secondary | ICD-10-CM

## 2018-11-04 MED ORDER — PENNSAID 2 % TD SOLN: 1.0000 "application " | Freq: Two times a day (BID) | TRANSDERMAL | 3 refills | Status: DC

## 2018-11-04 NOTE — Patient Instructions (Signed)
Nice to meet you Please try the exercises  Please try ice as needed  Please try the rub on medicine. Apply a peasize to the area two time daily  Please send me a message in MyChart with any questions or updates.  Please see me back in 4 weeks.   --Dr. Raeford Razor

## 2018-11-04 NOTE — Progress Notes (Signed)
[name] - 43 y.o. female MRN [id number]  Date of birth: 07/13/1975  SUBJECTIVE:  Including CC & ROS.  Chief Complaint  Patient presents with  . Knee Pain    bilateral knee    Penny DawleyJacqueline [name] is a 43 y.o. female that is presenting with bilateral knee pain.  The pain is occurring over the past few months.  She noticed it when she was helping her child ride a bike.  The pain is on the anterior proximal aspect of each knee.  It is worse when she is lying down in her bed at night.  It is also worse with rising from a seated position.  She denies any swelling or mechanical symptoms.  No history of injury.  Does not seem to be improving.  Pain is mild to moderate in nature.  It is localized to the knee.  No history of surgery.   Review of Systems  Constitutional: Negative for fever.  HENT: Negative for congestion.   Respiratory: Negative for cough.   Cardiovascular: Negative for chest pain.  Gastrointestinal: Negative for abdominal pain.  Musculoskeletal: Negative for gait problem.  Skin: Negative for color change.  Neurological: Negative for weakness.  Hematological: Negative for adenopathy.    HISTORY: Past Medical, Surgical, Social, and Family History Reviewed & Updated per EMR.   Pertinent Historical Findings include:  Past Medical History:  Diagnosis Date  . Allergy     Past Surgical History:  Procedure Laterality Date  . MOLE REMOVAL      No Known Allergies  Family History  Problem Relation Age of Onset  . Breast cancer Maternal Grandmother   . Hyperlipidemia Brother        AmerisourceBergen CorporationBig Brother  . Hyperlipidemia Brother        Little Brother  . Hypertension Brother        also blocked artery ? opened up.     Social History   Socioeconomic History  . Marital status: Married    Spouse name: Not on file  . Number of children: Not on file  . Years of education: Not on file  . Highest education level: Not on file  Occupational History  . Not on file  Social  Needs  . Financial resource strain: Not on file  . Food insecurity    Worry: Not on file    Inability: Not on file  . Transportation needs    Medical: Not on file    Non-medical: Not on file  Tobacco Use  . Smoking status: Never Smoker  . Smokeless tobacco: Never Used  Substance and Sexual Activity  . Alcohol use: Yes    Alcohol/week: 0.0 standard drinks    Comment: ocass  . Drug use: No  . Sexual activity: Not on file  Lifestyle  . Physical activity    Days per week: Not on file    Minutes per session: Not on file  . Stress: Not on file  Relationships  . Social Musicianconnections    Talks on phone: Not on file    Gets together: Not on file    Attends religious service: Not on file    Active member of club or organization: Not on file    Attends meetings of clubs or organizations: Not on file    Relationship status: Not on file  . Intimate partner violence    Fear of current or ex partner: Not on file    Emotionally abused: Not on file    Physically abused: Not  on file    Forced sexual activity: Not on file  Other Topics Concern  . 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   8+ hours of sleep per night   1 daughter (six years of age) hh of 3       Neg td  ocass etoh    Exercise .     PHYSICAL EXAM:  VS: BP 128/88   Ht 5\' 10"  (1.778 m)   Wt 135 lb (61.2 kg)   BMI 19.37 kg/m  Physical Exam Gen: NAD, alert, cooperative with exam, well-appearing ENT: normal lips, normal nasal mucosa,  Eye: normal EOM, normal conjunctiva and lids CV:  no edema, +2 pedal pulses   Resp: no accessory muscle use, non-labored,  Skin: no rashes, no areas of induration  Neuro: normal tone, normal sensation to touch Psych:  normal insight, alert and oriented MSK:  Left and right Knee: Normal to inspection with no erythema or effusion or obvious bony abnormalities. Palpation normal with no  warmth, joint line tenderness, patellar tenderness, or condyle tenderness. ROM full in flexion and extension and lower leg rotation. Ligaments with solid consistent endpoints including  LCL, MCL. Negative Mcmurray's  tests. Non painful patellar compression. Patellar glide without crepitus. Patellar and quadriceps tendons unremarkable. Hamstring and quadriceps strength is normal.  Weakness with hip abduction on the left. No leg length discrepancy. Neurovascularly intact    Limited ultrasound: Right and left knee:  Right knee:  No effusion in the suprapatellar pouch. Normal appearing quadricep and patellar tendon. Normal-appearing medial and lateral joint line  Left knee:  No effusion in the suprapatellar pouch. Normal appearing quadricep and patellar tendon. 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  Has some weakness with hip abduction.  Has no structural changes on ultrasound. -[place]aid. -Counseled on home exercise therapy and supportive care. -If no improvement will consider physical therapy or imaging.

## 2018-11-05 DIAGNOSIS — M222X9 Patellofemoral disorders, unspecified knee: Secondary | ICD-10-CM | POA: Insufficient documentation

## 2018-11-05 NOTE — Assessment & Plan Note (Signed)
Symptoms most consistent with patellofemoral syndrome.  Has some weakness with hip abduction.  Has no structural changes on ultrasound. -[place]aid. -Counseled on home exercise therapy and supportive care. -If no improvement will consider physical therapy or imaging.

## 2019-01-25 LAB — HM MAMMOGRAPHY

## 2019-02-23 ENCOUNTER — Encounter: Payer: Self-pay | Admitting: Internal Medicine

## 2019-05-03 ENCOUNTER — Ambulatory Visit: Payer: BC Managed Care – PPO | Admitting: Internal Medicine

## 2019-05-17 ENCOUNTER — Other Ambulatory Visit: Payer: Self-pay

## 2019-05-18 ENCOUNTER — Ambulatory Visit: Payer: BC Managed Care – PPO | Admitting: Internal Medicine

## 2019-05-18 ENCOUNTER — Encounter: Payer: Self-pay | Admitting: Internal Medicine

## 2019-05-18 VITALS — BP 118/60 | HR 91 | Temp 97.7°F | Wt 136.0 lb

## 2019-05-18 DIAGNOSIS — M76899 Other specified enthesopathies of unspecified lower limb, excluding foot: Secondary | ICD-10-CM

## 2019-05-18 NOTE — Progress Notes (Signed)
This visit occurred during the SARS-CoV-2 public health emergency.  Safety protocols were in place, including screening questions prior to the visit, additional usage of staff PPE, and extensive cleaning of exam room while observing appropriate contact time as indicated for disinfecting solutions.    Chief Complaint  Patient presents with  . Knee Pain    Pt states that in april her knees started bugging her after teaching daughter how to ride a bike she went to ortho but they did not find anything wrong and told her to do exercises but pt has not ssen any improvement     HPI: [name] 44 y.o. come in for knee pain  Saw dr Raeford Razor  In July  Felt to be patellofemoral syndrome and advised  Home exercises   Supportive care  If not consider pt and  Imaging but never got better   Did some exercises but not after ddnt seem to help has pain in suprapatellar area  bilaterally when sitting down if having to bend knee   Bilateral  Without swelling   Stiff  In am  But still has pulling  And   not getting better since July. This all began when she was helping her child learn to ride a bike and probably supported the bike walking or running up to a mile.  Onset of symptoms after that. Does not feel it is better and at sometimes has difficulty getting in and out of cars. There is no acute swelling or other joint issues.  She is frustrated that this is not getting better and is still bothering her ROS: See pertinent positives and negatives per HPI.  Past Medical History:  Diagnosis Date  . Allergy     Family History  Problem Relation Age of Onset  . Breast cancer Maternal Grandmother   . Hyperlipidemia Brother        [employer]  . Hyperlipidemia Brother        Little Brother  . Hypertension Brother        also blocked artery ? opened up.      Outpatient Medications Prior to Visit  Medication Sig Dispense Refill  . folic acid (FOLVITE) 1 MG tablet Take 1 mg by mouth daily.  3  .  JUNEL FE 1/20 1-20 MG-MCG tablet     . Diclofenac Sodium (PENNSAID) 2 % SOLN Place 1 application onto the skin 2 (two) times daily. (Patient not taking: Reported on 05/18/2019) 112 g 3   No facility-administered medications prior to visit.     EXAM:  BP 118/60 (BP Location: Right Arm, Patient Position: Sitting, Cuff Size: Normal)   Pulse 91   Temp 97.7 F (36.5 C) (Temporal)   Wt 136 lb (61.7 kg)   SpO2 99%   BMI 19.51 kg/m   Body mass index is 19.51 kg/m.  GENERAL: vitals reviewed and listed above, alert, oriented, appears well hydrated and in no acute distress HEENT: atraumatic, conjunctiva  clear, no obvious abnormalities on inspection of external nose and ears OP : Masked NECK: no obvious masses on inspection palpation  MS: moves all extremities without noticeable focal  abnormality bilateral tenderness in the suprapatellar area without effusion or obvious swelling no crepitus is felt she is able to walk without difficulty but when sitting down flex the knee and standing up she gets difficult symptoms.  No other acute findings. PSYCH: pleasant and cooperative, no obvious depression or anxiety  BP Readings from Last 3 Encounters:  05/18/19 118/60  11/04/18 128/88  12/18/17 98/60    ASSESSMENT AND PLAN:  Discussed the following assessment and plan: Suprapatellar pain persists 6 months which sounded like an overuse injury tendinitis or quadriceps attachment injury.  Reported normal ultrasound and probably suboptimal attempted exercises did not really help nor topicals or medications. I agree that I would have expected it to improve by now if it was an overuse injury with relative rest.  I do not see evidence of systemic arthritis or even bursitis however options discussed I would like her to see Dr. Katrinka Blazing and see what he thinks about an exercise program physical therapy or other interventions. Pictures given to explain the anatomy of the knee at this time. Quadriceps  tendonitis ?  suprapatellar pain with out bursitis   - see text failed  conservative rx for PF syndrome  -Patient advised to return or notify health care team  if  new concerns arise.  Patient Instructions  PLAN  appt  Reassessment to  Sports medicine   Ice cold therapy  At end of day 5- 10 minute .   Avoid  Deep knee bending .   For now  stilla ctsoi like a overuse injury but not getting better .    [name]. [name] M.D.

## 2019-05-18 NOTE — Patient Instructions (Signed)
PLAN  appt  Reassessment to  Sports medicine   Ice cold therapy  At end of day 5- 10 minute .   Avoid  Deep knee bending .   For now  stilla ctsoi like a overuse injury but not getting better .

## 2019-05-19 NOTE — Progress Notes (Signed)
Patient scheduled tomorrow.

## 2019-05-20 ENCOUNTER — Encounter: Payer: Self-pay | Admitting: Family Medicine

## 2019-05-20 ENCOUNTER — Other Ambulatory Visit: Payer: Self-pay

## 2019-05-20 ENCOUNTER — Ambulatory Visit: Payer: BC Managed Care – PPO | Admitting: Family Medicine

## 2019-05-20 DIAGNOSIS — M222X1 Patellofemoral disorders, right knee: Secondary | ICD-10-CM

## 2019-05-20 DIAGNOSIS — M222X2 Patellofemoral disorders, left knee: Secondary | ICD-10-CM

## 2019-05-20 MED ORDER — VITAMIN D (ERGOCALCIFEROL) 1.25 MG (50000 UNIT) PO CAPS: 50000.0000 [IU] | ORAL_CAPSULE | ORAL | 0 refills | Status: DC

## 2019-05-20 NOTE — Patient Instructions (Addendum)
Good to see you Patella strap  Once weekly vitamin D for 12 weeks K2 any dose at night  Quad exercises 3 times a week  Ice 20 minutes 2 times daily. Usually after activity and before bed. No jumping, running or deep squats See me again 4-6 weeks to make sure doing much much better

## 2019-05-20 NOTE — Progress Notes (Signed)
[name] [place] Phone: [phone] Subjective:   [name], am serving as a scribe for Dr. Hulan Saas.  I'm seeing this patient by the request  of:  Panosh, Standley Brooking, MD  CC: Bilateral knee pain started in April trying to teach her kid how to ride a bike and felt like she pulled something. Hurts worse when sitting on toilet or going down steps. Pain used to be a 10/10 and now it is a 4-5/10 annoying pulling feeling. Only hurts when having to squat. Using ice, heat and taking advil and using voltaren topical when needed   XBD:[id number]  [name] is a 44 y.o. female coming in with complaint of bilateral knee pain. Previously seen by other providers for patellofemoral pain syndrome as well as quadriceps tendonitis. Patient's pain began when she was teaching child to ride bicycle over the Summer. Pain has not improved.  Patient has been doing the exercises occasionally.  Has tried some over-the-counter medications with minimal improvement as well.     Past Medical History:  Diagnosis Date  . Allergy    Past Surgical History:  Procedure Laterality Date  . MOLE REMOVAL     Social History   Socioeconomic History  . Marital status: Married    Spouse name: Not on file  . Number of children: Not on file  . Years of education: Not on file  . Highest education level: Not on file  Occupational History  . Not on file  Tobacco Use  . Smoking status: Never Smoker  . Smokeless tobacco: Never Used  Substance and Sexual Activity  . Alcohol use: Yes    Alcohol/week: 0.0 standard drinks    Comment: ocass  . Drug use: No  . Sexual activity: Not on file  Other Topics Concern  . 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   8+ hours of sleep per night   1 daughter (six years of age) hh of 3       Neg td  ocass  etoh    Exercise .   Social Determinants of Health   Financial Resource Strain:   . Difficulty of Paying Living Expenses: Not on file  Food Insecurity:   . Worried About Charity fundraiser in the Last Year: Not on file  . Ran Out of Food in the Last Year: Not on file  Transportation Needs:   . Lack of Transportation (Medical): Not on file  . Lack of Transportation (Non-Medical): Not on file  Physical Activity:   . Days of Exercise per Week: Not on file  . Minutes of Exercise per Session: Not on file  Stress:   . Feeling of Stress : Not on file  Social Connections:   . Frequency of Communication with Friends and Family: Not on file  . Frequency of Social Gatherings with Friends and Family: Not on file  . Attends Religious Services: Not on file  . Active Member of Clubs or Organizations: Not on file  . Attends Archivist Meetings: Not on file  . Marital Status: Not on file   No Known Allergies Family History  Problem Relation Age of Onset  . Breast cancer Maternal Grandmother   . Hyperlipidemia Brother        [employer]  . Hyperlipidemia Brother  Little Brother  . Hypertension Brother        also blocked artery ? opened up.    Current Outpatient Medications (Endocrine & Metabolic):  Marland Kitchen  JUNEL FE 1/20 1-20 MG-MCG tablet,      Current Outpatient Medications (Hematological):  .  folic acid (FOLVITE) 1 MG tablet, Take 1 mg by mouth daily.  Current Outpatient Medications (Other):  Marland Kitchen  Vitamin D, Ergocalciferol, (DRISDOL) 1.25 MG (50000 UNIT) CAPS capsule, Take 1 capsule (50,000 Units total) by mouth every 7 (seven) days.    Past medical history, social, surgical and family history all reviewed in electronic medical record.  No pertanent information unless stated regarding to the chief complaint.   Review of Systems:  No headache, visual changes, nausea, vomiting, diarrhea, constipation, dizziness, abdominal pain, skin rash, fevers, chills, night sweats,  weight loss, swollen lymph nodes, body aches, joint swelling, chest pain, shortness of breath, mood changes. POSITIVE muscle aches  Objective  Blood pressure 130/80, pulse 87, height 5\' 10"  (1.778 m), weight 137 lb (62.1 kg), SpO2 99 %.   General: No apparent distress alert and oriented x3 mood and affect normal, dressed appropriately.  HEENT: Pupils equal, extraocular movements intact  Respiratory: Patient's speak in full sentences and does not appear short of breath  Cardiovascular: No lower extremity edema, non tender, no erythema  Skin: Warm dry intact with no signs of infection or rash on extremities or on axial skeleton.  Abdomen: Soft nontender  Neuro: Cranial nerves II through XII are intact, neurovascularly intact in all extremities with 2+ DTRs and 2+ pulses.  Lymph: No lymphadenopathy of posterior or anterior cervical chain or axillae bilaterally.  Gait normal with good balance and coordination.  MSK:  Non tender with full range of motion and good stability and symmetric strength and tone of shoulders, elbows, wrist, hip, and ankles bilaterally.  Bilateral knee exam also right greater than left shows lateral tracking noted.  Mild positive patellar grind noted.  Patient has full range of motion though noted.    Impression and Recommendations:     This case required medical decision making of moderate complexity. The above documentation has been reviewed and is accurate and complete , DO       Note: This dictation was prepared with Dragon dictation along with smaller phrase technology. Any transcriptional errors that result from this process are unintentional.

## 2019-05-20 NOTE — Assessment & Plan Note (Addendum)
Patient does have signs and symptoms consistent with more of the patellofemoral syndrome but due to the longevity of this concern from patient actually having more of a subluxation that could have contributed to it.  We discussed Tru pull lite bracing, home exercises, as well as imaging.  Discussed the possibility of formal physical therapy which we will consider if no significant improvement.  No significant swelling noted today so did not feel that injection was necessary.  Vitamin D given.

## 2019-07-01 ENCOUNTER — Other Ambulatory Visit: Payer: Self-pay

## 2019-07-01 ENCOUNTER — Ambulatory Visit (INDEPENDENT_AMBULATORY_CARE_PROVIDER_SITE_OTHER): Payer: BC Managed Care – PPO | Admitting: Family Medicine

## 2019-07-01 ENCOUNTER — Encounter: Payer: Self-pay | Admitting: Family Medicine

## 2019-07-01 DIAGNOSIS — M222X1 Patellofemoral disorders, right knee: Secondary | ICD-10-CM

## 2019-07-01 DIAGNOSIS — M222X2 Patellofemoral disorders, left knee: Secondary | ICD-10-CM

## 2019-07-01 NOTE — Progress Notes (Signed)
[name] [address] [place] [address] Phone: [phone] Subjective:    I'm seeing this patient by the request  of:  Panosh, Neta Mends, MD  CC: Right greater than left knee pain follow-up  KCL:[id number]   05/20/2019 Patient does have signs and symptoms consistent with more of the patellofemoral syndrome but due to the longevity of this concern from patient actually having more of a subluxation that could have contributed to it.  We discussed Tru pull lite bracing, home exercises, as well as imaging.  Discussed the possibility of formal physical therapy which we will consider if no significant improvement.  No significant swelling noted today so did not feel that injection was necessary.  Vitamin D given.  Update 07/01/2019 [name] is a 44 y.o. female coming in with complaint of bilateral knee pain. Patient states that she is doing a lot better. Is able to squat below 90 degrees without pain. Is using K2 and Once weekly Vit D. Wants to make sure she is ok to start working out.  Overall though feels like she is 95% better.    Past Medical History:  Diagnosis Date  . Allergy    Past Surgical History:  Procedure Laterality Date  . MOLE REMOVAL     Social History   Socioeconomic History  . Marital status: Married    Spouse name: Not on file  . Number of children: Not on file  . Years of education: Not on file  . Highest education level: Not on file  Occupational History  . Not on file  Tobacco Use  . Smoking status: Never Smoker  . Smokeless tobacco: Never Used  Substance and Sexual Activity  . Alcohol use: Yes    Alcohol/week: 0.0 standard drinks    Comment: ocass  . Drug use: No  . Sexual activity: Not on file  Other Topics Concern  . 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   8+ hours of sleep per night   1 daughter (six  years of age) hh of 3       Neg td  ocass etoh    Exercise .   Social Determinants of Health   Financial Resource Strain:   . Difficulty of Paying Living Expenses: Not on file  Food Insecurity:   . Worried About Programme researcher, broadcasting/film/video in the Last Year: Not on file  . Ran Out of Food in the Last Year: Not on file  Transportation Needs:   . Lack of Transportation (Medical): Not on file  . Lack of Transportation (Non-Medical): Not on file  Physical Activity:   . Days of Exercise per Week: Not on file  . Minutes of Exercise per Session: Not on file  Stress:   . Feeling of Stress : Not on file  Social Connections:   . Frequency of Communication with Friends and Family: Not on file  . Frequency of Social Gatherings with Friends and Family: Not on file  . Attends Religious Services: Not on file  . Active Member of Clubs or Organizations: Not on file  . Attends Banker Meetings: Not on file  . Marital Status: Not on file   No Known Allergies Family History  Problem Relation Age of Onset  . Breast cancer Maternal Grandmother   . Hyperlipidemia Brother  Big Brother  . Hyperlipidemia Brother        Little Brother  . Hypertension Brother        also blocked artery ? opened up.    Current Outpatient Medications (Endocrine & Metabolic):  Marland Kitchen  JUNEL FE 1/20 1-20 MG-MCG tablet,      Current Outpatient Medications (Hematological):  .  folic acid (FOLVITE) 1 MG tablet, Take 1 mg by mouth daily.  Current Outpatient Medications (Other):  Marland Kitchen  Vitamin D, Ergocalciferol, (DRISDOL) 1.25 MG (50000 UNIT) CAPS capsule, Take 1 capsule (50,000 Units total) by mouth every 7 (seven) days.   Reviewed prior external information including notes and imaging from  primary care provider As well as notes that were available from care everywhere and other healthcare systems.  Past medical history, social, surgical and family history all reviewed in electronic medical record.  No  pertanent information unless stated regarding to the chief complaint.   Review of Systems:  No headache, visual changes, nausea, vomiting, diarrhea, constipation, dizziness, abdominal pain, skin rash, fevers, chills, night sweats, weight loss, swollen lymph nodes, body aches, joint swelling, chest pain, shortness of breath, mood changes. POSITIVE muscle aches  Objective  Blood pressure 120/82, pulse 88, height 5\' 10"  (1.778 m), weight 136 lb (61.7 kg), SpO2 98 %.   General: No apparent distress alert and oriented x3 mood and affect normal, dressed appropriately.  HEENT: Pupils equal, extraocular movements intact  Respiratory: Patient's speak in full sentences and does not appear short of breath  Cardiovascular: No lower extremity edema, non tender, no erythema  Skin: Warm dry intact with no signs of infection or rash on extremities or on axial skeleton.  Abdomen: Soft nontender  Neuro: Cranial nerves II through XII are intact, neurovascularly intact in all extremities with 2+ DTRs and 2+ pulses.  Lymph: No lymphadenopathy of posterior or anterior cervical chain or axillae bilaterally.  Gait normal with good balance and coordination.  MSK:  tender with full range of motion and good stability and symmetric strength and tone of shoulders, elbows, wrist, hip, and ankles bilaterally.  Right knee exam still shows some mild lateral tracking of the patella noted.  Negative patella grind noted.  Very mild apprehension first resistance on the patella.  Noted negative McMurray's.   Impression and Recommendations:      The above documentation has been reviewed and is accurate and complete Penny Pulley, DO       Note: This dictation was prepared with Dragon dictation along with smaller phrase technology. Any transcriptional errors that result from this process are unintentional.

## 2019-07-01 NOTE — Assessment & Plan Note (Signed)
Patellofemoral syndrome.  Discussed with patient in great length.  Doing very well but given a Tru lite pull brace and I think will give some stability with patient increasing activity.  Discussed home exercises, which activities to do which wants to avoid.  As long as patient does well can follow-up with me as needed

## 2019-07-01 NOTE — Patient Instructions (Signed)
Brace with activity for 6 weeks  See me again in 6 weeks

## 2019-08-06 ENCOUNTER — Other Ambulatory Visit: Payer: Self-pay | Admitting: Family Medicine

## 2019-12-29 IMAGING — US US ABDOMEN COMPLETE
1 series · 14 of 25 positions shown · non-contrast
Comparison: None.

CLINICAL DATA: Abdominal pain

EXAM:
ABDOMEN ULTRASOUND COMPLETE

[Series 1: us abdomen complete · 0.20mm/px · 101 acquisitions, 14 frames shown]
[im 1/101]
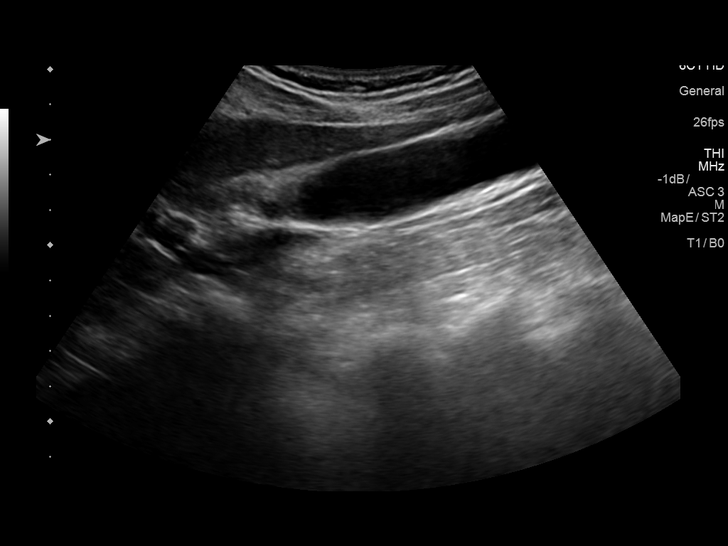
[im 9/101]
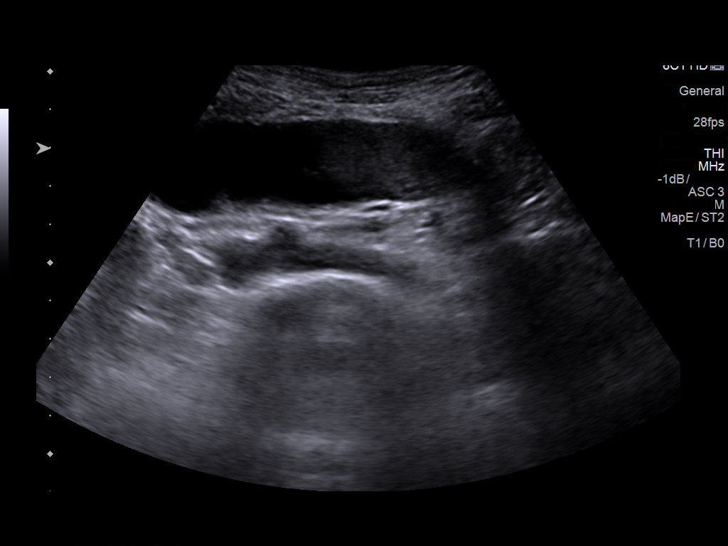
[im 17/101]
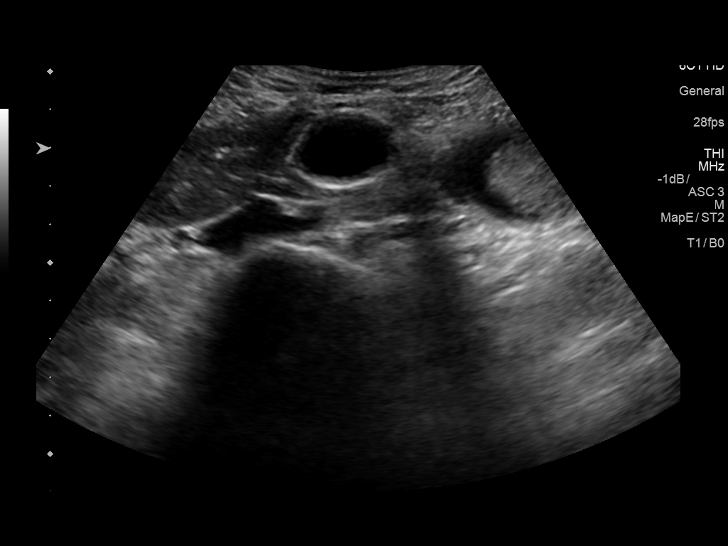
[im 26/101]
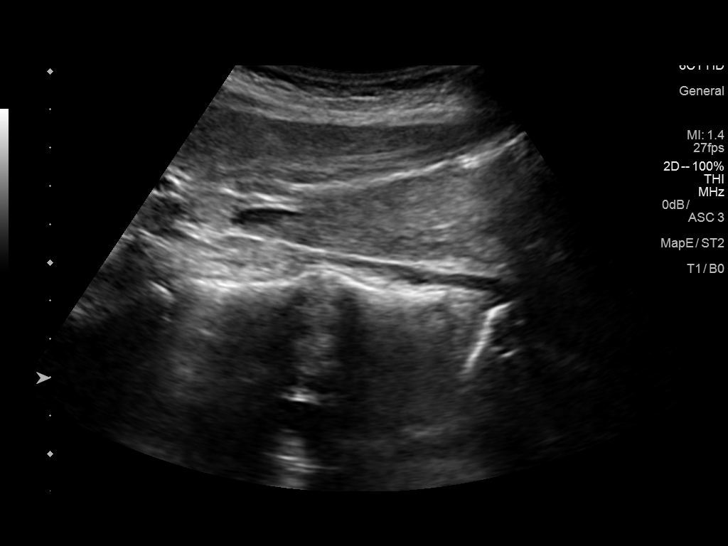
[im 34/101]
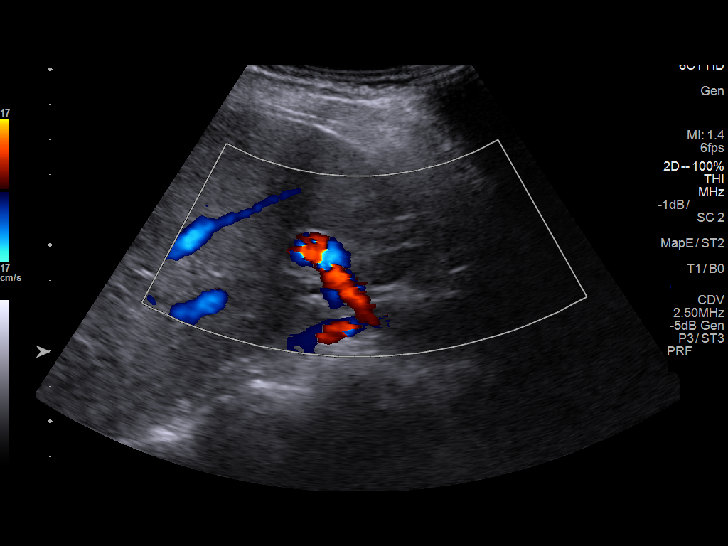
[im 38/101]
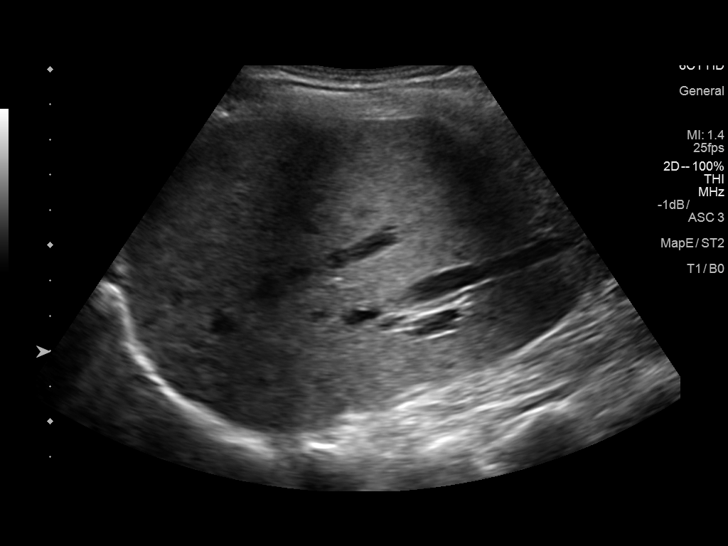
[im 46/101]
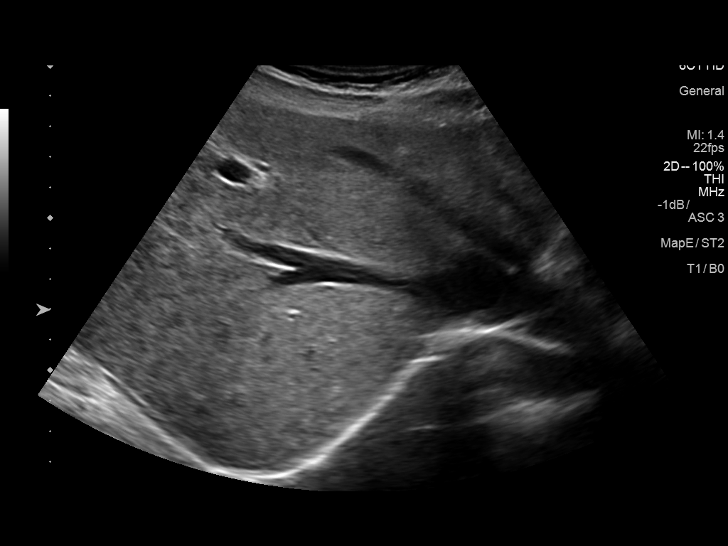
[im 55/101]
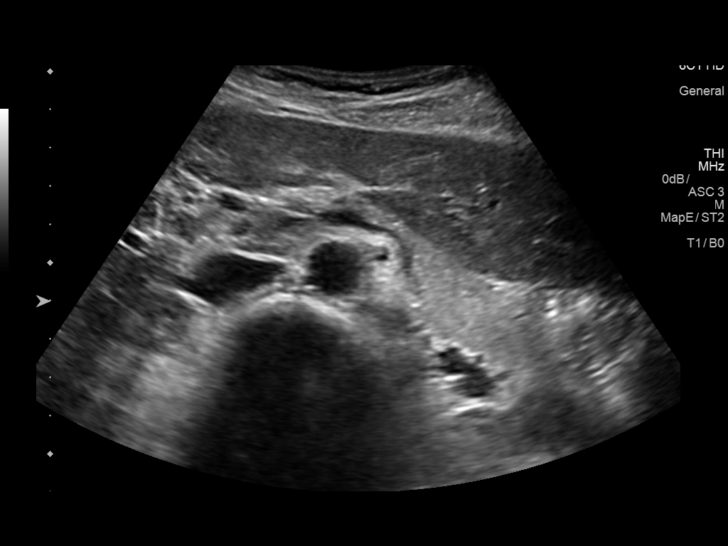
[im 63/101]
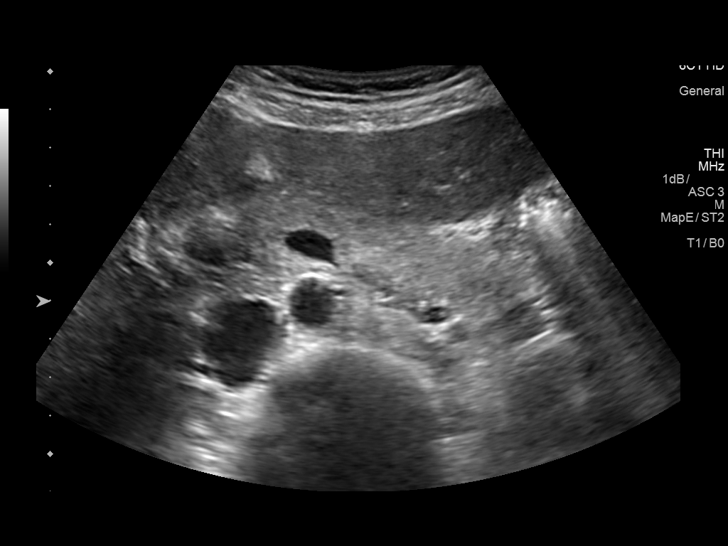
[im 67/101]
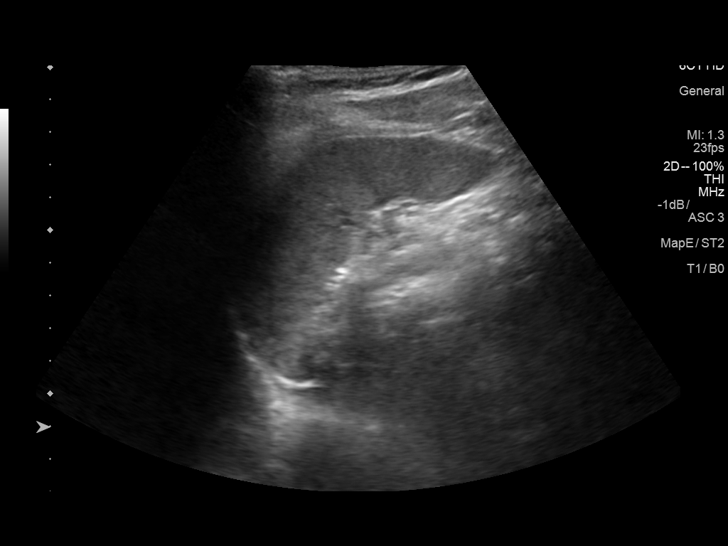
[im 76/101]
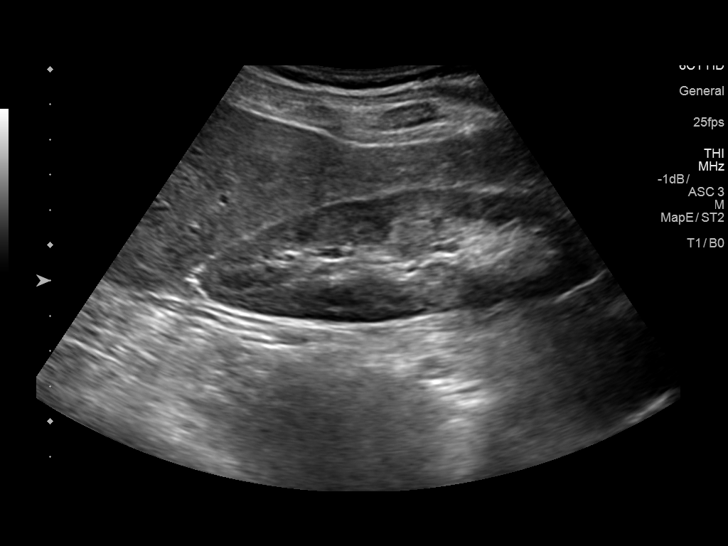
[im 84/101]
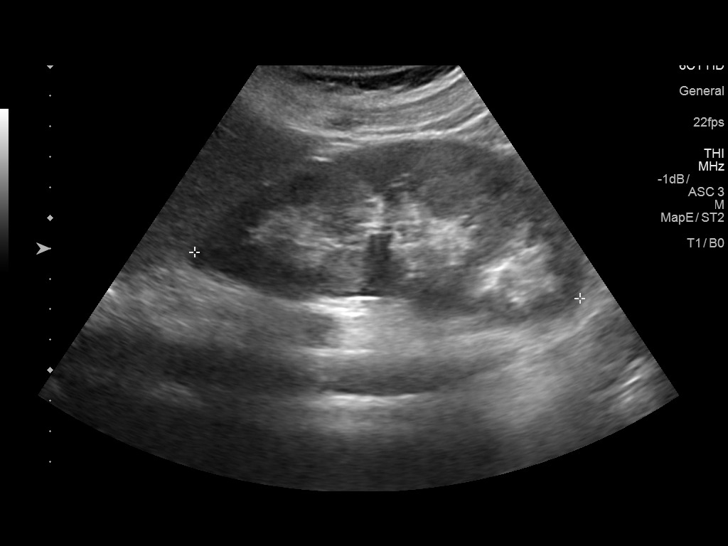
[im 92/101]
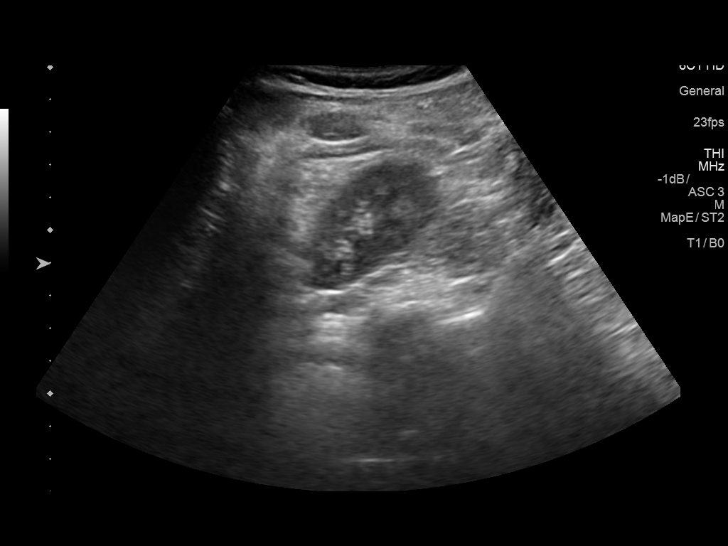
[im 101/101]
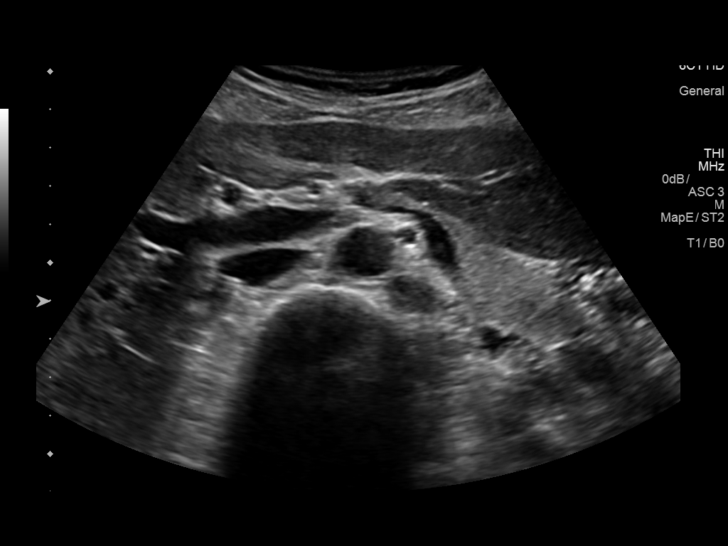

[14 of 25 positions shown; findings below may reference images not displayed]

FINDINGS: Gallbladder: Tumefactive sludge is noted within the gallbladder
measuring 4 cm in greatest dimension. No definitive calculi are
seen. No wall thickening or pericholecystic fluid is noted.

Common bile duct: Diameter: 5.6 mm.

Liver: No focal lesion identified. Within normal limits in
parenchymal echogenicity. Portal vein is patent on color Doppler
imaging with normal direction of blood flow towards the liver.

IVC: No abnormality visualized.

Pancreas: Visualized portion unremarkable.

Spleen: Size and appearance within normal limits.

Right Kidney: Length: 11.5 cm.. Echogenicity within normal limits.
No mass or hydronephrosis visualized.

Left Kidney: Length: 12.7 cm.. Echogenicity within normal limits. No
mass or hydronephrosis visualized.

Abdominal aorta: No aneurysm visualized.

Other findings: None.
IMPRESSION: Tumefactive sludge ball within the gall bladder. No complicating
factors are noted.

No other focal abnormality is seen.

## 2020-03-03 ENCOUNTER — Other Ambulatory Visit: Payer: Self-pay

## 2020-03-03 ENCOUNTER — Other Ambulatory Visit: Payer: BC Managed Care – PPO

## 2020-03-03 DIAGNOSIS — Z20822 Contact with and (suspected) exposure to covid-19: Secondary | ICD-10-CM

## 2020-03-04 LAB — SARS-COV-2, NAA 2 DAY TAT

## 2020-03-04 LAB — NOVEL CORONAVIRUS, NAA: SARS-CoV-2, NAA: NOT DETECTED

## 2021-01-29 ENCOUNTER — Encounter: Payer: Self-pay | Admitting: Internal Medicine

## 2021-03-18 ENCOUNTER — Other Ambulatory Visit: Payer: Self-pay | Admitting: Physician Assistant

## 2021-07-25 ENCOUNTER — Telehealth (INDEPENDENT_AMBULATORY_CARE_PROVIDER_SITE_OTHER): Payer: BC Managed Care – PPO | Admitting: Internal Medicine

## 2021-07-25 ENCOUNTER — Encounter: Payer: Self-pay | Admitting: Internal Medicine

## 2021-07-25 ENCOUNTER — Telehealth: Payer: Self-pay | Admitting: Internal Medicine

## 2021-07-25 DIAGNOSIS — Z62898 Other specified problems related to upbringing: Secondary | ICD-10-CM

## 2021-07-25 DIAGNOSIS — Z635 Disruption of family by separation and divorce: Secondary | ICD-10-CM

## 2021-07-25 DIAGNOSIS — Z638 Other specified problems related to primary support group: Secondary | ICD-10-CM | POA: Diagnosis not present

## 2021-07-25 NOTE — Patient Instructions (Signed)
Will do a referral to [facility]  Dr Dellia Cloud if available and she will reach out also .  ?

## 2021-07-25 NOTE — Progress Notes (Signed)
?Virtual Visit via Video Note ? ?I connected with [name] on 07/25/21 at 11:15 AM EDT by a video enabled telemedicine application and verified that I am speaking with the correct person using two identifiers. ?Location patient: work  ?Location provider:home office ?Persons participating in the virtual visit: patient, provider ? ?[place] national recommendations  regarding COVID 19 pandemic   video visit is advised over in office visit for this patient.  ?Patient aware  of the limitations of evaluation and management by telemedicine and  availability of in person appointments. and agreed to proceed. ? ? ?HPI: ?[name] presents for video visit interested in getting a referral or advised for family therapy. ?She has an almost 15 year old daughter and marriage that had separation in August 2021 with the final divorce in October 22.  Relationship with father has been difficult for her ;her father is unemployed and is an "addict" . there is visitation 2 hours a week in a supervised with grandmother and a mutual site. Daughter  doesn't want to attend these"visits"  ?These have been difficult times for her relationship with her father and mother is encouraged to write down and express her feelings and thoughts about her father's relationship. ?They decided that it would be helpful for both of them to do "family therapy" mother and daughter to help optimize  relationships.and communications.  ?Daughter's name is Angelina Hofstra;attends  [employer] :will be in eighth grade next year.  She apparently is doing well in school and not had a drop in her grades or social life. ? ?There are future court dates planned ?ROS: See pertinent positives and negatives per HPI. ? ?Past Medical History:  ?Diagnosis Date  ? Allergy   ? ? ?Past Surgical History:  ?Procedure Laterality Date  ? MOLE REMOVAL    ? ? ?Family History  ?Problem Relation Age of Onset  ? Breast cancer Maternal Grandmother   ? Hyperlipidemia Brother    ?     Big Brother  ? Hyperlipidemia Brother   ?     Little Brother  ? Hypertension Brother   ?     also blocked artery ? opened up.  ? ? ?Social History  ? ?Tobacco Use  ? Smoking status: Never  ? Smokeless tobacco: Never  ?Vaping Use  ? Vaping Use: Never used  ?Substance Use Topics  ? Alcohol use: Yes  ?  Alcohol/week: 0.0 standard drinks  ?  Comment: ocass  ? Drug use: No  ? ? ? ? ?Current Outpatient Medications:  ?  folic acid (FOLVITE) 1 MG tablet, Take 1 mg by mouth daily., Disp: , Rfl: 3 ?  JUNEL FE 1/20 1-20 MG-MCG tablet, , Disp: , Rfl:  ?  Vitamin D, Ergocalciferol, (DRISDOL) 1.25 MG (50000 UNIT) CAPS capsule, TAKE 1 CAPSULE (50,000 UNITS TOTAL) BY MOUTH EVERY 7 (SEVEN) DAYS., Disp: 12 capsule, Rfl: 0 ? ?EXAM: ?BP Readings from Last 3 Encounters:  ?07/01/19 120/82  ?05/20/19 130/80  ?05/18/19 118/60  ? ? ?VITALS per patient if applicable: ? ?GENERAL: alert, oriented, appears well and in no acute distress ? ?HEENT: atraumatic, conjunttiva clear, no obvious abnormalities on inspection of external nose and ears ? ?NECK: normal movements of the head and neck ? ? ?PSYCH/NEURO: pleasant and cooperative, no obvious depression or anxiety, speech and thought processing grossly intact ?Lab Results  ?Component Value Date  ? WBC 5.3 12/18/2017  ? HGB 14.5 12/18/2017  ? HCT 42.3 12/18/2017  ? PLT 173.0 12/18/2017  ? GLUCOSE 99 12/18/2017  ?  CHOL 167 12/18/2017  ? TRIG 140.0 12/18/2017  ? HDL 66.00 12/18/2017  ? LDLCALC 73 12/18/2017  ? ALT 11 12/18/2017  ? AST 13 12/18/2017  ? NA 140 12/18/2017  ? K 3.7 12/18/2017  ? CL 104 12/18/2017  ? CREATININE 0.79 12/18/2017  ? BUN 11 12/18/2017  ? CO2 29 12/18/2017  ? TSH 1.30 12/18/2017  ? ? ?ASSESSMENT AND PLAN: ? ?Discussed the following assessment and plan: ? ?  ICD-10-CM   ?1. Child affected by parental relationship distress  Z62.898 Ambulatory referral to Psychology  ?  ?2. Family disruption  Z63.8 Ambulatory referral to Psychology  ?  ?3. Disruption of family by  separation and divorce  Z63.5 Ambulatory referral to Psychology  ?  ? ?Will arrange a psychology counseling referral but she can reach out on her to call [facility].  And ask for Dr. Dellia Cloud. ?Mom  seems to have a healthy  insight and feels this would help her child get through  tough times  with least emotional damage anger and relationship with her father  ?Counseled.  ? Expectant management and discussion of plan and treatment with opportunity to ask questions and all were answered. The patient agreed with the plan and demonstrated an understanding of the instructions. ?  ?Advised to call back or seek an in-person evaluation if worsening  or having  further concerns  in interim. ?No follow-ups on file. ? ? ? ?Berniece Andreas, MD  ?

## 2021-07-25 NOTE — Telephone Encounter (Signed)
Patient called in requesting a recommendation for a family counselor. ? ?Patient could be contacted at [phone]. ? ?Please advise. ?

## 2021-07-25 NOTE — Telephone Encounter (Signed)
Spoke with patient and advised scheduling Vv since patient has not been seen in a while. Vv scheduled for today @ 11:15 ?

## 2022-01-12 LAB — COLOGUARD: COLOGUARD: NEGATIVE

## 2022-02-04 LAB — HM MAMMOGRAPHY

## 2022-08-11 ENCOUNTER — Encounter: Payer: Self-pay | Admitting: *Deleted

## 2022-09-01 ENCOUNTER — Encounter: Payer: Self-pay | Admitting: Internal Medicine

## 2022-09-01 NOTE — Progress Notes (Unsigned)
[facility]

## 2023-02-20 LAB — HM MAMMOGRAPHY

## 2024-01-20 ENCOUNTER — Ambulatory Visit: Payer: Self-pay

## 2024-01-20 NOTE — Telephone Encounter (Signed)
 FYI Only or Action Required?: FYI only for provider.  Patient was last seen in primary care on 07/25/2021 by Panosh, Apolinar POUR, MD.  Called Nurse Triage reporting Abdominal Pain.  Symptoms began today.  Interventions attempted: Nothing.  Symptoms are: unchanged.  Triage Disposition: See PCP Within 2 Weeks  Patient/caregiver understands and will follow disposition?: Yes     Copied from CRM [id number]. Topic: Clinical - Red Word Triage >> Jan 20, 2024  8:18 AM Mia F wrote: Red Word that prompted transfer to Nurse Triage: Pt noticed a hard moveable ball in the abdomen area. Just noticed it this morning. Not painful. More noticeable when laying down. Not so noticeable when standing up. Reason for Disposition  Abdominal pain is a chronic symptom (recurrent or ongoing AND present > 4 weeks)  Answer Assessment - Initial Assessment Questions 1. LOCATION: Where does it hurt?      Close to belly button area, smaller than a golf ball and moves around 2. RADIATION: Does the pain shoot anywhere else? (e.g., chest, back)     Denies pain 3. ONSET: When did the pain begin? (e.g., minutes, hours or days ago)      This am 4. SUDDEN: Gradual or sudden onset?     Denies pain 5. PATTERN Does the pain come and go, or is it constant?     denies 6. SEVERITY: How bad is the pain?  (e.g., Scale 1-10; mild, moderate, or severe)     Denies pain 7. RECURRENT SYMPTOM: Have you ever had this type of stomach pain before? If Yes, ask: When was the last time? and What happened that time?      na 8. CAUSE: What do you think is causing the stomach pain? (e.g., gallstones, recent abdominal surgery)     unknown 9. RELIEVING/AGGRAVATING FACTORS: What makes it better or worse? (e.g., antacids, bending or twisting motion, bowel movement)     na 10. OTHER SYMPTOMS: Do you have any other symptoms? (e.g., back pain, diarrhea, fever, urination pain, vomiting)       na 11. PREGNANCY: Is there any  chance you are pregnant? When was your last menstrual period?       na  Protocols used: Abdominal Pain - Female-A-AH

## 2024-01-21 ENCOUNTER — Encounter: Payer: Self-pay | Admitting: Internal Medicine

## 2024-01-21 ENCOUNTER — Ambulatory Visit: Admitting: Internal Medicine

## 2024-01-21 VITALS — BP 120/80 | HR 67 | Temp 98.3°F | Wt 133.8 lb

## 2024-01-21 DIAGNOSIS — K429 Umbilical hernia without obstruction or gangrene: Secondary | ICD-10-CM | POA: Diagnosis not present

## 2024-01-21 NOTE — Progress Notes (Signed)
     Established Patient Office Visit     CC/Reason for Visit: Movable lump in abdomen  HPI: [name] is a 48 y.o. female who is coming in today for the above mentioned reasons.  Yesterday noticed a small lump around her umbilical area.  No pain, nausea, vomiting constipation.   Past Medical/Surgical History: Past Medical History:  Diagnosis Date   Allergy     Past Surgical History:  Procedure Laterality Date   MOLE REMOVAL      Social History:  reports that she has never smoked. She has never used smokeless tobacco. She reports current alcohol use. She reports that she does not use drugs.  Allergies: No Known Allergies  Family History:  Family History  Problem Relation Age of Onset   Breast cancer Maternal Grandmother    Hyperlipidemia Brother        Big Brother   Hyperlipidemia Brother        Little Brother   Hypertension Brother        also blocked artery ? opened up.     Current Outpatient Medications:    folic acid (FOLVITE) 1 MG tablet, Take 1 mg by mouth daily., Disp: , Rfl: 3   JUNEL FE 1/20 1-20 MG-MCG tablet, , Disp: , Rfl:    Vitamin D , Ergocalciferol , (DRISDOL ) 1.25 MG (50000 UNIT) CAPS capsule, TAKE 1 CAPSULE (50,000 UNITS TOTAL) BY MOUTH EVERY 7 (SEVEN) DAYS., Disp: 12 capsule, Rfl: 0  Review of Systems:  Negative unless indicated in HPI.   Physical Exam: Vitals:   01/21/24 0707  BP: 120/80  Pulse: 67  Temp: 98.3 F (36.8 C)  TempSrc: Oral  SpO2: 98%  Weight: 133 lb 12.8 oz (60.7 kg)    Body mass index is 19.2 kg/m.   Physical Exam Abdominal:      Comments: Small umbilical hernia, easily reducible.      Impression and Plan:  Umbilical hernia without obstruction and without gangrene  -She has a small umbilical hernia, advised observation for now.   Time spent:20 minutes reviewing chart, interviewing and examining patient and formulating plan of care.     Penny Theophilus Andrews, MD [facility]  [facility]

## 2024-03-28 ENCOUNTER — Encounter: Payer: Self-pay | Admitting: Family Medicine

## 2024-03-28 ENCOUNTER — Ambulatory Visit: Admitting: Family Medicine

## 2024-03-28 VITALS — BP 124/68 | HR 88 | Temp 98.0°F | Ht 70.0 in | Wt 128.0 lb

## 2024-03-28 DIAGNOSIS — K5900 Constipation, unspecified: Secondary | ICD-10-CM | POA: Diagnosis not present

## 2024-03-28 DIAGNOSIS — K429 Umbilical hernia without obstruction or gangrene: Secondary | ICD-10-CM

## 2024-03-28 DIAGNOSIS — R3129 Other microscopic hematuria: Secondary | ICD-10-CM | POA: Diagnosis not present

## 2024-03-28 DIAGNOSIS — R1031 Right lower quadrant pain: Secondary | ICD-10-CM

## 2024-03-28 DIAGNOSIS — M545 Low back pain, unspecified: Secondary | ICD-10-CM | POA: Diagnosis not present

## 2024-03-28 LAB — URINALYSIS, MICROSCOPIC ONLY

## 2024-03-28 LAB — COMPREHENSIVE METABOLIC PANEL WITH GFR
ALT: 13 U/L (ref 0–35)
AST: 18 U/L (ref 0–37)
Albumin: 4.3 g/dL (ref 3.5–5.2)
Alkaline Phosphatase: 89 U/L (ref 39–117)
BUN: 16 mg/dL (ref 6–23)
CO2: 27 meq/L (ref 19–32)
Calcium: 9.3 mg/dL (ref 8.4–10.5)
Chloride: 99 meq/L (ref 96–112)
Creatinine, Ser: 0.76 mg/dL (ref 0.40–1.20)
GFR: 93.32 mL/min (ref >60.00)
Glucose, Bld: 82 mg/dL (ref 70–99)
Potassium: 4.1 meq/L (ref 3.5–5.1)
Sodium: 135 meq/L (ref 135–145)
Total Bilirubin: 0.4 mg/dL (ref 0.2–1.2)
Total Protein: 8.3 g/dL (ref 6.0–8.3)

## 2024-03-28 LAB — CBC WITH DIFFERENTIAL/PLATELET
Basophils Absolute: 0.1 K/uL (ref 0.0–0.1)
Basophils Relative: 0.6 % (ref 0.0–3.0)
Eosinophils Absolute: 0.1 K/uL (ref 0.0–0.7)
Eosinophils Relative: 1.5 % (ref 0.0–5.0)
HCT: 38.9 % (ref 36.0–46.0)
Hemoglobin: 13.3 g/dL (ref 12.0–15.0)
Lymphocytes Relative: 23.9 % (ref 12.0–46.0)
Lymphs Abs: 2.3 K/uL (ref 0.7–4.0)
MCHC: 34.2 g/dL (ref 30.0–36.0)
MCV: 89.7 fl (ref 78.0–100.0)
Monocytes Absolute: 0.5 K/uL (ref 0.1–1.0)
Monocytes Relative: 5.4 % (ref 3.0–12.0)
Neutro Abs: 6.6 K/uL (ref 1.4–7.7)
Neutrophils Relative %: 68.6 % (ref 43.0–77.0)
Platelets: 283 K/uL (ref 150.0–400.0)
RBC: 4.34 Mil/uL (ref 3.87–5.11)
RDW: 13.4 % (ref 11.5–15.5)
WBC: 9.6 K/uL (ref 4.0–10.5)

## 2024-03-28 LAB — POCT URINALYSIS DIPSTICK
Bilirubin, UA: NEGATIVE
Blood, UA: POSITIVE — AB
Glucose, UA: NEGATIVE
Ketones, UA: NEGATIVE
Leukocytes, UA: NEGATIVE
Nitrite, UA: NEGATIVE
Protein, UA: NEGATIVE
Spec Grav, UA: 1.025 (ref 1.010–1.025)
Urobilinogen, UA: 0.2 U/dL
pH, UA: 5 (ref 5.0–8.0)

## 2024-03-28 NOTE — Progress Notes (Unsigned)
 Established Patient Office Visit   Subjective  Patient ID: [name], female    DOB: Jul 27, 1975  Age: 48 y.o. MRN: [id number]  Chief Complaint  Patient presents with  . Acute Visit    Right side Abdominal pian started the Nov 24th after playing pickle ball,     Pt is a 47 yo female followed by Dr. Charlett and seen for acute concern.  Pt endorses  right-sided abdominal pain since 11/24 following a pickleball tournament on 11/22. Initially, the pain was a dull ache on the right side, which developed into a hard knot. The pain was sharp for two days and has since become a persistent ache, described as feeling like being hit in the stomach. The pain is located in the front and back, with pulsating sensations when lying flat, relieved by lying on her side. The pain does not worsen with movement, but she has to sit and stand slowly due to discomfort.  Has an umbilical hernia, which she describes as a hardness near her belly button. Since the diagnosis, she reports increased bloating and bulging. She is able to manually reduce the hernia and describes it as 'squishy and gross.'  She reports constipation with infrequent, pellet-like stools. She has been taking Metamucil and probiotics, and used Exlax to aid bowel movements, with her last significant bowel movement occurring two days ago. She typically drinks about 32 ounces of water daily.  She has experienced a decrease in appetite and has lost five pounds over the past week, which is unusual for her. No nausea, vomiting, or changes in urination frequency or pain. She reports feeling full and hard after eating, with relief from burping and passing gas. She maintains a clean diet with no recent changes or increased fast food consumption. She has not engaged in sports or strenuous activities since the onset of symptoms.      Patient Active Problem List   Diagnosis Date Noted  . 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 Allergy    Past Surgical History:  Procedure Laterality Date  . MOLE REMOVAL     Social History   Tobacco Use  . Smoking status: Never  . Smokeless tobacco: Never  Vaping Use  . Vaping status: Never Used  Substance Use Topics  . Alcohol use: Yes    Alcohol/week: 0.0 standard drinks of alcohol    Comment: ocass  . Drug use: No   Family History  Problem Relation Age of Onset  . Breast cancer Maternal Grandmother   . Hyperlipidemia Brother        [employer]  . Hyperlipidemia Brother        Little Brother  . Hypertension Brother        also blocked artery ? opened up.   No Known Allergies  ROS Negative unless stated above    Objective:     BP 124/68 (BP Location: Left Arm, Patient Position: Sitting, Cuff Size: Normal)   Pulse 88   Temp 98 F (36.7 C) (Oral)   Ht 5' 10 (1.778 m)   Wt 128 lb (58.1 kg)   SpO2 99%   BMI 18.37 kg/m  BP Readings from Last 3 Encounters:  03/28/24 124/68  01/21/24 120/80  07/01/19 120/82   Wt Readings from Last 3 Encounters:  03/28/24 128 lb (58.1 kg)  01/21/24 133 lb 12.8 oz (60.7 kg)  07/01/19 136 lb (61.7 kg)      Physical Exam Constitutional:  Appearance: Normal appearance.  HENT:     Head: Normocephalic and atraumatic.     Nose: Nose normal.     Mouth/Throat:     Mouth: Mucous membranes are moist.  Eyes:     Conjunctiva/sclera: Conjunctivae normal.  Cardiovascular:     Rate and Rhythm: Normal rate.  Pulmonary:     Effort: Pulmonary effort is normal.  Abdominal:     General: Bowel sounds are normal.     Palpations: Abdomen is soft.     Tenderness: There is abdominal tenderness. There is guarding. There is no right CVA tenderness or left CVA tenderness.     Hernia: A hernia is present.  Skin:    General: Skin is warm and dry.  Neurological:     Mental Status: She is alert and oriented to person, place, and time.        05/18/2019    3:03 PM 12/18/2017    5:11 PM  Depression  screen PHQ 2/9  Decreased Interest 0 0  Down, Depressed, Hopeless 0 0  PHQ - 2 Score 0 0       No data to display           No results found for any visits on 03/28/24.    Assessment & Plan:   RLQ abdominal pain -     POCT urinalysis dipstick; Future -     Urine Microscopic; Future -     Comprehensive metabolic panel with GFR; Future -     CBC with Differential/Platelet; Future -     CT ABDOMEN PELVIS W CONTRAST; Future -     Urine Culture; Future  Acute right-sided low back pain without sciatica -     POCT urinalysis dipstick; Future -     Urine Microscopic; Future -     Comprehensive metabolic panel with GFR; Future -     CBC with Differential/Platelet; Future  Umbilical hernia without obstruction and without gangrene  Constipation, unspecified constipation type  Other microscopic hematuria    Return if symptoms worsen or fail to improve.   Clotilda JONELLE Single, MD

## 2024-03-28 NOTE — Patient Instructions (Signed)
 Orders for labs were placed as well as an order for CT scan.  The radiology department will contact you in regards to scheduling the CT scan.  I have included a handout about appendicitis for your general information.  If symptoms become worse proceed to nearest ED.

## 2024-03-29 ENCOUNTER — Telehealth: Payer: Self-pay

## 2024-03-29 ENCOUNTER — Inpatient Hospital Stay
Admission: RE | Admit: 2024-03-29 | Discharge: 2024-03-29 | Disposition: A | Source: Ambulatory Visit | Attending: Family Medicine

## 2024-03-29 ENCOUNTER — Telehealth: Payer: Self-pay | Admitting: *Deleted

## 2024-03-29 DIAGNOSIS — R935 Abnormal findings on diagnostic imaging of other abdominal regions, including retroperitoneum: Secondary | ICD-10-CM

## 2024-03-29 DIAGNOSIS — R1031 Right lower quadrant pain: Secondary | ICD-10-CM

## 2024-03-29 LAB — URINE CULTURE
MICRO NUMBER:: 17295692
Result:: NO GROWTH
SPECIMEN QUALITY:: ADEQUATE

## 2024-03-29 MED ORDER — IOPAMIDOL (ISOVUE-300) INJECTION 61%
100.0000 mL | Freq: Once | INTRAVENOUS | Status: AC | PRN
  Administered 2024-03-29: 100 mL via INTRAVENOUS

## 2024-03-29 NOTE — Telephone Encounter (Signed)
 Copied from CRM #[id number]. Topic: General - Other >> Mar 29, 2024  9:10 AM Aleatha C wrote: Reason for CRM: Diane from [facility] radiology stat  call report  for CT ABDOMEN AND PELVIS WITH CONTRAST has been sent over

## 2024-03-29 NOTE — Telephone Encounter (Signed)
 E2c2 agent will let pt know it can take 2 or 3 business days and pt just had ct scan today

## 2024-03-29 NOTE — Telephone Encounter (Signed)
 Order is placed. Follow up with pt. Inform her of provider's reports on the CT and labs.   Pt reports her urine looks abnormal and wonder if she has uti. Inform pt her urine culture has not come back yet to confirm that. Pt shared she feels uncomfortable,  like somebody is punching her. Pt is concerns about infection and want to make sure what she has doesn't make her feels sicker. Pt wants to know if there is something  take for gall bladder liver inflammation.   Inform pt that someone will contact her about scheduling for MRI sometimes today or tomorrow. Pt ask what's different in CT and MRI, why didn't provider order MRI cuz she paid $700 for the CT. Explain to pt MRI goes more in detail on the specific area and insurance needs more specific diagnose code for it for the coverage.   Please advise.

## 2024-03-29 NOTE — Telephone Encounter (Signed)
 Ct shows irregularity at the gall bladder liver are inflammation or  other  Radiologist advises  MRI of liver gall bladder biliary area  to define further  liver tests done per Dr Mercer are normal as well as cbc   Lab Results  Component Value Date   WBC 9.6 03/28/2024   HGB 13.3 03/28/2024   HCT 38.9 03/28/2024   PLT 283.0 03/28/2024   GLUCOSE 82 03/28/2024   CHOL 167 12/18/2017   TRIG 140.0 12/18/2017   HDL 66.00 12/18/2017   LDLCALC 73 12/18/2017   ALT 13 03/28/2024   AST 18 03/28/2024   NA 135 03/28/2024   K 4.1 03/28/2024   CL 99 03/28/2024   CREATININE 0.76 03/28/2024   BUN 16 03/28/2024   CO2 27 03/28/2024   TSH 1.30 12/18/2017   Please order  asap MRI of gall bladder/ liver biliary area abdomen as per radiology advice

## 2024-03-29 NOTE — Telephone Encounter (Signed)
 Copied from CRM #[id number]. Topic: Clinical - Lab/Test Results >> Mar 29, 2024 11:54 AM Charolett L wrote: Reason for CRM: Patient would like a call back to go over lab results  CB#[phone]

## 2024-03-30 ENCOUNTER — Ambulatory Visit: Payer: Self-pay | Admitting: Family Medicine

## 2024-03-30 ENCOUNTER — Telehealth: Payer: Self-pay | Admitting: Family Medicine

## 2024-03-30 NOTE — Telephone Encounter (Signed)
 Patient was notified by message and dr Mercer

## 2024-03-30 NOTE — Telephone Encounter (Signed)
 Patient seen by this provider on 03/28/2024.  Contacted regarding CT results as this provider was OOO on 03/29/24 when results phoned in.  Patient already made aware of results by PCP yesterday.  Follow-up MRI ordered and to be done tomorrow at 750.  Patient not currently having RUQ pain and inquires if there is anything that can be taken for inflammation.  Questions answered satisfaction.  Will wait on MRI abd/pelvis results for further recommendations.  Clotilda Single, MD

## 2024-03-31 ENCOUNTER — Telehealth: Payer: Self-pay

## 2024-03-31 ENCOUNTER — Ambulatory Visit: Payer: Self-pay | Admitting: Internal Medicine

## 2024-03-31 ENCOUNTER — Other Ambulatory Visit: Payer: Self-pay

## 2024-03-31 ENCOUNTER — Inpatient Hospital Stay: Admission: RE | Admit: 2024-03-31 | Discharge: 2024-03-31 | Attending: Internal Medicine | Admitting: Internal Medicine

## 2024-03-31 DIAGNOSIS — R1011 Right upper quadrant pain: Secondary | ICD-10-CM

## 2024-03-31 DIAGNOSIS — K828 Other specified diseases of gallbladder: Secondary | ICD-10-CM

## 2024-03-31 DIAGNOSIS — K838 Other specified diseases of biliary tract: Secondary | ICD-10-CM

## 2024-03-31 DIAGNOSIS — R1031 Right lower quadrant pain: Secondary | ICD-10-CM

## 2024-03-31 DIAGNOSIS — R935 Abnormal findings on diagnostic imaging of other abdominal regions, including retroperitoneum: Secondary | ICD-10-CM

## 2024-03-31 NOTE — Progress Notes (Signed)
 Disc with patient need surgical consult asap if pain progresses  go to ed  Pain gets worse after eating  not too bad this am and no fever   Entire sx have been last 11 days no vomiting

## 2024-03-31 NOTE — Telephone Encounter (Signed)
 Sent message to pcp for stat message

## 2024-04-06 NOTE — Telephone Encounter (Signed)
 Follow up with pt. Pt reports she is still having discomfort every time she eat solid food. When drinking liquid is okay. Pt shared she was starving the other day and decided to have tuna fish- mini fish sandwich, her lower back was hurting really bad. In the morning she is fine as her stomach is empty. Pt express that she is disappointed that it is taking this long for her to get in and hope that she will get the surgery urgently. She continues if she doesn't have the surgery soon, she will go to ED. Pt is concerns about her losing more weight as she is already small- had lost 10 lbs unintentionally. Pt wants to know if Dr. Charlett can push out the call for her to get the surgery urgently. Inform pt, it will be the surgeon call when she have the consult tomorrow. But will inform Dr. Charlett.    Inform pt of provider message. Pt verbalized understanding. Pt states she doesn't have any question at this time.

## 2024-04-07 ENCOUNTER — Other Ambulatory Visit: Payer: Self-pay

## 2024-04-07 ENCOUNTER — Encounter (HOSPITAL_COMMUNITY): Payer: Self-pay | Admitting: Surgery

## 2024-04-07 ENCOUNTER — Ambulatory Visit: Payer: Self-pay | Admitting: Surgery

## 2024-04-07 DIAGNOSIS — K828 Other specified diseases of gallbladder: Secondary | ICD-10-CM

## 2024-04-07 NOTE — H&P (View-Only) (Signed)
 REFERRING PHYSICIAN:  Panosh, Wanda Kotvan, MD PROVIDER:  LEONOR MACARIO DAWN, MD MRN: [id number] DOB: [birth year] DATE OF ENCOUNTER: 04/07/2024  Subjective    Chief Complaint: New Consultation (gallbladder mass,)   History of Present Illness: [name] is a 48 y.o. female who is seen today as an office consultation for evaluation of New Consultation (gallbladder mass,)  She recently saw her PCP for evaluation of RUQ abdominal pain. Labs including LFTs were unremarkable. She had a CT scan on 12/2, which showed irregularities and thickening of the gallbladder wall, as well as a possible adjacent liver lesion. She then had an MRI on 12/4, which also showed gallbladder wall thickening and dilation of the fundus. The possible liver lesion on CT was not present on MRI, however no contrast was given on the MRI. The patient was referred for further evaluation. She continues to have abdominal pain. She says it began on 11/24, and has been very persistent since then. It is constant but gets worse after she eats. She has been consuming mostly liquids and has lost about 10 pounds since the symptoms began. She had an abdominal US  in 2019 which showed gallbladder sludge, but does not recall if she was having abdominal pain at the time. She denies any recent RUQ pain prior to her current pain which began two weeks ago. She denies darkening of her urine and does not appear clinically jaundiced today.  She is otherwise in good health. Her only prior abdominal surgery is a C section.     Review of Systems: A complete review of systems was obtained from the patient.  I have reviewed this information and discussed as appropriate with the patient.  See HPI as well for other ROS.   Medical History: History reviewed. No pertinent past medical history.  There is no problem list on file for this patient.   Past Surgical History:  Procedure Laterality Date   CESAREAN SECTION       No Known  Allergies  Current Outpatient Medications on File Prior to Visit  Medication Sig Dispense Refill   JUNEL FE 1/20, 28, 1 mg-20 mcg (21)/75 mg (7) tablet Take 1 tablet by mouth once daily     folic acid (FOLVITE) 1 MG tablet Take 1,000 mcg by mouth once daily     No current facility-administered medications on file prior to visit.    Family History  Problem Relation Age of Onset   Breast cancer Mother    Hyperlipidemia (Elevated cholesterol) Father    High blood pressure (Hypertension) Father      Social History   Tobacco Use  Smoking Status Never  Smokeless Tobacco Never     Social History   Socioeconomic History   Marital status: Married  Tobacco Use   Smoking status: Never   Smokeless tobacco: Never  Substance and Sexual Activity   Drug use: Never   Social Drivers of Health   Housing Stability: Unknown (04/07/2024)   Housing Stability Vital Sign    Homeless in the Last Year: No    Objective:   Vitals:   04/07/24 1333  BP: 134/88  Pulse: 103  Temp: 36.7 C (98 F)  SpO2: 99%  Weight: 57.2 kg (126 lb)  Height: 175.3 cm (5' 9)    Body mass index is 18.61 kg/m.  Physical Exam Vitals reviewed.  Constitutional:      General: She is not in acute distress.    Appearance: Normal appearance.  HENT:  Head: Normocephalic and atraumatic.  Eyes:     General: No scleral icterus.    Conjunctiva/sclera: Conjunctivae normal.  Cardiovascular:     Rate and Rhythm: Normal rate and regular rhythm.     Heart sounds: No murmur heard. Pulmonary:     Effort: Pulmonary effort is normal. No respiratory distress.     Breath sounds: Normal breath sounds. No wheezing.  Abdominal:     General: There is no distension.     Palpations: Abdomen is soft.     Comments: Palpable mass in the RUQ with focal tenderness to palpation. Small umbilical hernia, soft and nontender.  Skin:    General: Skin is warm and dry.     Coloration: Skin is not jaundiced.   Neurological:     General: No focal deficit present.     Mental Status: She is alert and oriented to person, place, and time.        Labs, Imaging and Diagnostic Testing: CT abd/pelvis 03/29/24: IMPRESSION: 1. Irregular and lobular gallbladder wall thickening, possibly severe inflammation; neoplasm cannot be excluded. MRI is recommended. 2. 12 x 11 mm irregular lesion in the medial segment of the left hepatic lobe adjacent to the gallbladder fossa, possibly small abscess or neoplasm. Recommend MRI for further evaluation. 3. Intrahepatic and extrahepatic biliary dilatation. Correlation with liver function tests as well as MRI is recommended .  MR abdomen without contrast 03/31/24: IMPRESSION: 1. Mass-like thickening of the gallbladder with dilatation of the fundus, measuring 4.7 x 3.8 x 6.6 cm, with surrounding fluid and soft tissue stranding. If there are clinical signs or symptoms of infection, imaging findings may reflect the underlying . severe acute cholecystitis. Alternatively, in the absence of any signs or symptoms of infection, imaging findings are concerning for malignancy. Prompt surgical consultation is advised. 2. The abnormality within segment 4b reported on the CT is not confidently identified on the current exam, which may reflect lack of iv contrast material. 3. Fusiform dilatation of the proximal common bile duct up to 9 mm with mild to moderate intrahepatic ductal dilatation. No obstructing choledocholithiasis identified.    Assessment and Plan:     Diagnoses and all orders for this visit:  Gallbladder mass -     CA 19-9 - [facility] -     CBC, No Differential/Platelet - [facility] -     CMP14+eGFR - [facility]    48 yo female with acute onset RUQ abdominal pain. I personally reviewed her labs, imaging and referral notes. Symptoms are concerning for acute cholecystitis, however her imaging is very concerning for a neoplastic process. The gallbladder is very  enlarged with irregularity of the wall. The bile ducts appear prominent, but LFTs were normal and she does not appear jaundiced. Given the severity of her symptoms, will proceed with diagnostic laparoscopy and cholecystectomy. I discussed a laparoscopic cholecystectomy, with the possibility of a radical cholecystectomy if malignancy is confirmed intra-op. It is also possible the imaging findings represent severe cholecystitis. I discussed the possibility of a subtotal fenestrating cholecystectomy and drain placement. I reviewed the benefits and the risks of bleeding and infection, especially if a radical cholecystectomy is needed. She expressed understanding and consents to proceed with surgery.     SHELBY LYNN ALLEN, MD

## 2024-04-07 NOTE — Progress Notes (Signed)
 REFERRING PHYSICIAN:  Panosh, Wanda Kotvan, MD PROVIDER:  LEONOR MACARIO DAWN, MD MRN: [id number] DOB: [birth year] DATE OF ENCOUNTER: 04/07/2024  Subjective    Chief Complaint: New Consultation (gallbladder mass,)   History of Present Illness: [name] is a 48 y.o. female who is seen today as an office consultation for evaluation of New Consultation (gallbladder mass,)  She recently saw her PCP for evaluation of RUQ abdominal pain. Labs including LFTs were unremarkable. She had a CT scan on 12/2, which showed irregularities and thickening of the gallbladder wall, as well as a possible adjacent liver lesion. She then had an MRI on 12/4, which also showed gallbladder wall thickening and dilation of the fundus. The possible liver lesion on CT was not present on MRI, however no contrast was given on the MRI. The patient was referred for further evaluation. She continues to have abdominal pain. She says it began on 11/24, and has been very persistent since then. It is constant but gets worse after she eats. She has been consuming mostly liquids and has lost about 10 pounds since the symptoms began. She had an abdominal US  in 2019 which showed gallbladder sludge, but does not recall if she was having abdominal pain at the time. She denies any recent RUQ pain prior to her current pain which began two weeks ago. She denies darkening of her urine and does not appear clinically jaundiced today.  She is otherwise in good health. Her only prior abdominal surgery is a C section.     Review of Systems: A complete review of systems was obtained from the patient.  I have reviewed this information and discussed as appropriate with the patient.  See HPI as well for other ROS.   Medical History: History reviewed. No pertinent past medical history.  There is no problem list on file for this patient.   Past Surgical History:  Procedure Laterality Date   CESAREAN SECTION       No Known  Allergies  Current Outpatient Medications on File Prior to Visit  Medication Sig Dispense Refill   JUNEL FE 1/20, 28, 1 mg-20 mcg (21)/75 mg (7) tablet Take 1 tablet by mouth once daily     folic acid (FOLVITE) 1 MG tablet Take 1,000 mcg by mouth once daily     No current facility-administered medications on file prior to visit.    Family History  Problem Relation Age of Onset   Breast cancer Mother    Hyperlipidemia (Elevated cholesterol) Father    High blood pressure (Hypertension) Father      Social History   Tobacco Use  Smoking Status Never  Smokeless Tobacco Never     Social History   Socioeconomic History   Marital status: Married  Tobacco Use   Smoking status: Never   Smokeless tobacco: Never  Substance and Sexual Activity   Drug use: Never   Social Drivers of Health   Housing Stability: Unknown (04/07/2024)   Housing Stability Vital Sign    Homeless in the Last Year: No    Objective:   Vitals:   04/07/24 1333  BP: 134/88  Pulse: 103  Temp: 36.7 C (98 F)  SpO2: 99%  Weight: 57.2 kg (126 lb)  Height: 175.3 cm (5' 9)    Body mass index is 18.61 kg/m.  Physical Exam Vitals reviewed.  Constitutional:      General: She is not in acute distress.    Appearance: Normal appearance.  HENT:  Head: Normocephalic and atraumatic.  Eyes:     General: No scleral icterus.    Conjunctiva/sclera: Conjunctivae normal.  Cardiovascular:     Rate and Rhythm: Normal rate and regular rhythm.     Heart sounds: No murmur heard. Pulmonary:     Effort: Pulmonary effort is normal. No respiratory distress.     Breath sounds: Normal breath sounds. No wheezing.  Abdominal:     General: There is no distension.     Palpations: Abdomen is soft.     Comments: Palpable mass in the RUQ with focal tenderness to palpation. Small umbilical hernia, soft and nontender.  Skin:    General: Skin is warm and dry.     Coloration: Skin is not jaundiced.   Neurological:     General: No focal deficit present.     Mental Status: She is alert and oriented to person, place, and time.        Labs, Imaging and Diagnostic Testing: CT abd/pelvis 03/29/24: IMPRESSION: 1. Irregular and lobular gallbladder wall thickening, possibly severe inflammation; neoplasm cannot be excluded. MRI is recommended. 2. 12 x 11 mm irregular lesion in the medial segment of the left hepatic lobe adjacent to the gallbladder fossa, possibly small abscess or neoplasm. Recommend MRI for further evaluation. 3. Intrahepatic and extrahepatic biliary dilatation. Correlation with liver function tests as well as MRI is recommended .  MR abdomen without contrast 03/31/24: IMPRESSION: 1. Mass-like thickening of the gallbladder with dilatation of the fundus, measuring 4.7 x 3.8 x 6.6 cm, with surrounding fluid and soft tissue stranding. If there are clinical signs or symptoms of infection, imaging findings may reflect the underlying . severe acute cholecystitis. Alternatively, in the absence of any signs or symptoms of infection, imaging findings are concerning for malignancy. Prompt surgical consultation is advised. 2. The abnormality within segment 4b reported on the CT is not confidently identified on the current exam, which may reflect lack of iv contrast material. 3. Fusiform dilatation of the proximal common bile duct up to 9 mm with mild to moderate intrahepatic ductal dilatation. No obstructing choledocholithiasis identified.    Assessment and Plan:     Diagnoses and all orders for this visit:  Gallbladder mass -     CA 19-9 - [facility] -     CBC, No Differential/Platelet - [facility] -     CMP14+eGFR - [facility]    48 yo female with acute onset RUQ abdominal pain. I personally reviewed her labs, imaging and referral notes. Symptoms are concerning for acute cholecystitis, however her imaging is very concerning for a neoplastic process. The gallbladder is very  enlarged with irregularity of the wall. The bile ducts appear prominent, but LFTs were normal and she does not appear jaundiced. Given the severity of her symptoms, will proceed with diagnostic laparoscopy and cholecystectomy. I discussed a laparoscopic cholecystectomy, with the possibility of a radical cholecystectomy if malignancy is confirmed intra-op. It is also possible the imaging findings represent severe cholecystitis. I discussed the possibility of a subtotal fenestrating cholecystectomy and drain placement. I reviewed the benefits and the risks of bleeding and infection, especially if a radical cholecystectomy is needed. She expressed understanding and consents to proceed with surgery.     SHELBY LYNN ALLEN, MD

## 2024-04-07 NOTE — Pre-Procedure Instructions (Signed)
-------------    SDW INSTRUCTIONS given:  Your procedure is scheduled on 12/12.  Report to [facility] at 12:30 P.M., and check in at the Admitting office.  Any questions or running late day of surgery: call [phone]    Remember:  Do not eat after midnight the night before your surgery  You may drink clear liquids until 12:00 PM the day of your surgery.   Clear liquids allowed are: Water, Non-Citrus Juices (without pulp), Carbonated Beverages, Clear Tea, Black Coffee Only, and Gatorade    Take these medicines the morning of surgery with A SIP OF WATER   May take these medicines IF NEEDED: Tylenol  As of today, STOP taking any Aspirin (unless otherwise instructed by your surgeon) Aleve, Naproxen, Ibuprofen, Motrin, Advil, Goody's, BC's, all herbal medications, fish oil, and all vitamins.   Do NOT Smoke (Tobacco/Vaping) 24 hours prior to your procedure  If you use a CPAP at night, you may bring all equipment for your overnight stay.     You will be asked to remove any contacts, glasses, piercing's, hearing aid's, dentures/partials prior to surgery. Please bring cases for these items if needed.     Patients discharged the day of surgery will not be allowed to drive home, and someone needs to stay with them for 24 hours.  SURGICAL WAITING ROOM VISITATION Patients may have no more than 2 support people in the waiting area - these visitors may rotate.   Pre-op nurse will coordinate an appropriate time for 1 ADULT support person, who may not rotate, to accompany patient in pre-op.  Children under the age of 30 must have an adult with them who is not the patient and must remain in the main waiting area with an adult.  If the patient needs to stay at the hospital during part of their recovery, the visitor guidelines for inpatient rooms apply.  Please refer to the [facility] website for the visitor guidelines for any additional information.   Special instructions:    [facility]- Preparing For Surgery   Please follow these instructions carefully.   Shower the NIGHT BEFORE SURGERY and the MORNING OF SURGERY with DIAL Soap.   Pat yourself dry with a CLEAN TOWEL.  Wear CLEAN PAJAMAS to bed the night before surgery  Place CLEAN SHEETS on your bed the night of your first shower and DO NOT SLEEP WITH PETS.   Additional instructions for the day of surgery: DO NOT APPLY any lotions, deodorants, cologne, or perfumes.   Do not wear jewelry or makeup Do not wear nail polish, gel polish, artificial nails, or any other type of covering on natural nails (fingers and toes) Do not bring valuables to the hospital. [facility] is not responsible for valuables/personal belongings. Put on clean/comfortable clothes.  Please brush your teeth.  Ask your nurse before applying any prescription medications to the skin.

## 2024-04-07 NOTE — Progress Notes (Signed)
 PCP - Dr. Apolinar [facility] Cardiologist - denies  PPM/ICD - denies   Chest x-ray - denies EKG - denies Stress Test - denies ECHO - denies Cardiac Cath - denies  CPAP - denies  DM- denies  ASA/Blood Thinner Instructions: n/a   ERAS Protcol - clears until 1200  COVID TEST- n/a  Anesthesia review: no  Patient verbally denies any shortness of breath, fever, cough and chest pain during phone call      Questions were answered. Patient verbalized understanding of instructions.

## 2024-04-08 ENCOUNTER — Observation Stay (HOSPITAL_COMMUNITY): Admission: RE | Admit: 2024-04-08 | Discharge: 2024-04-09 | Disposition: A | Attending: Surgery | Admitting: Surgery

## 2024-04-08 ENCOUNTER — Encounter (HOSPITAL_COMMUNITY): Payer: Self-pay | Admitting: Surgery

## 2024-04-08 ENCOUNTER — Ambulatory Visit (HOSPITAL_COMMUNITY): Admitting: Certified Registered Nurse Anesthetist

## 2024-04-08 ENCOUNTER — Encounter (HOSPITAL_COMMUNITY): Admission: RE | Disposition: A | Payer: Self-pay | Source: Home / Self Care | Attending: Surgery

## 2024-04-08 DIAGNOSIS — K828 Other specified diseases of gallbladder: Secondary | ICD-10-CM

## 2024-04-08 DIAGNOSIS — Z9049 Acquired absence of other specified parts of digestive tract: Principal | ICD-10-CM

## 2024-04-08 LAB — PREPARE RBC (CROSSMATCH)

## 2024-04-08 LAB — ABO/RH: ABO/RH(D): O POS

## 2024-04-08 LAB — POCT PREGNANCY, URINE: Preg Test, Ur: NEGATIVE

## 2024-04-08 SURGERY — LAPAROSCOPIC CHOLECYSTECTOMY
Anesthesia: General

## 2024-04-08 MED ORDER — FENTANYL CITRATE (PF) 250 MCG/5ML IJ SOLN
INTRAMUSCULAR | Status: AC
  Filled 2024-04-08: qty 5

## 2024-04-08 MED ORDER — FENTANYL CITRATE (PF) 250 MCG/5ML IJ SOLN
INTRAMUSCULAR | Status: DC | PRN
  Administered 2024-04-08: 50 ug via INTRAVENOUS
  Administered 2024-04-08: 25 ug via INTRAVENOUS
  Administered 2024-04-08: 50 ug via INTRAVENOUS
  Administered 2024-04-08 (×2): 25 ug via INTRAVENOUS

## 2024-04-08 MED ORDER — MIDAZOLAM HCL (PF) 2 MG/2ML IJ SOLN
INTRAMUSCULAR | Status: DC | PRN
  Administered 2024-04-08: 2 mg via INTRAVENOUS

## 2024-04-08 MED ORDER — MIDAZOLAM HCL 2 MG/2ML IJ SOLN
INTRAMUSCULAR | Status: AC
  Filled 2024-04-08: qty 2

## 2024-04-08 MED ORDER — ACETAMINOPHEN 500 MG PO TABS
1000.0000 mg | ORAL_TABLET | Freq: Four times a day (QID) | ORAL | Status: DC
  Administered 2024-04-08 – 2024-04-09 (×2): 1000 mg via ORAL
  Filled 2024-04-08 (×2): qty 2

## 2024-04-08 MED ORDER — DEXAMETHASONE SOD PHOSPHATE PF 10 MG/ML IJ SOLN
INTRAMUSCULAR | Status: DC | PRN
  Administered 2024-04-08: 10 mg via INTRAVENOUS

## 2024-04-08 MED ORDER — SUGAMMADEX SODIUM 200 MG/2ML IV SOLN
INTRAVENOUS | Status: DC | PRN
  Administered 2024-04-08: 226.8 mg via INTRAVENOUS

## 2024-04-08 MED ORDER — DIPHENHYDRAMINE HCL 12.5 MG/5ML PO ELIX: 12.5000 mg | ORAL_SOLUTION | Freq: Four times a day (QID) | ORAL | Status: DC | PRN

## 2024-04-08 MED ORDER — DIPHENHYDRAMINE HCL 50 MG/ML IJ SOLN: 12.5000 mg | Freq: Four times a day (QID) | INTRAMUSCULAR | Status: DC | PRN

## 2024-04-08 MED ORDER — DROPERIDOL 2.5 MG/ML IJ SOLN: 0.6250 mg | Freq: Once | INTRAMUSCULAR | Status: DC | PRN

## 2024-04-08 MED ORDER — POLYETHYLENE GLYCOL 3350 17 G PO PACK: 17.0000 g | PACK | Freq: Every day | ORAL | Status: DC | PRN

## 2024-04-08 MED ORDER — METHOCARBAMOL 500 MG PO TABS
500.0000 mg | ORAL_TABLET | Freq: Three times a day (TID) | ORAL | Status: DC
  Administered 2024-04-08 – 2024-04-09 (×2): 500 mg via ORAL
  Filled 2024-04-08 (×2): qty 1

## 2024-04-08 MED ORDER — BUPIVACAINE-EPINEPHRINE (PF) 0.25% -1:200000 IJ SOLN
INTRAMUSCULAR | Status: AC
  Filled 2024-04-08: qty 30

## 2024-04-08 MED ORDER — KETOROLAC TROMETHAMINE 15 MG/ML IJ SOLN
15.0000 mg | Freq: Three times a day (TID) | INTRAMUSCULAR | Status: DC
  Administered 2024-04-08 – 2024-04-09 (×2): 15 mg via INTRAVENOUS
  Filled 2024-04-08 (×2): qty 1

## 2024-04-08 MED ORDER — LACTATED RINGERS IV SOLN: INTRAVENOUS | Status: DC

## 2024-04-08 MED ORDER — ACETAMINOPHEN 10 MG/ML IV SOLN
INTRAVENOUS | Status: AC
  Filled 2024-04-08: qty 100

## 2024-04-08 MED ORDER — ACETAMINOPHEN 10 MG/ML IV SOLN
INTRAVENOUS | Status: DC | PRN
  Administered 2024-04-08: 1000 mg via INTRAVENOUS

## 2024-04-08 MED ORDER — CEFAZOLIN SODIUM-DEXTROSE 2-4 GM/100ML-% IV SOLN
2.0000 g | INTRAVENOUS | Status: AC
  Administered 2024-04-08: 2 g via INTRAVENOUS
  Filled 2024-04-08: qty 100

## 2024-04-08 MED ORDER — OXYCODONE HCL 5 MG PO TABS: 5.0000 mg | ORAL_TABLET | Freq: Four times a day (QID) | ORAL | 0 refills | Status: AC | PRN

## 2024-04-08 MED ORDER — LIDOCAINE 2% (20 MG/ML) 5 ML SYRINGE: INTRAMUSCULAR | Status: DC | PRN

## 2024-04-08 MED ORDER — CHLORHEXIDINE GLUCONATE 0.12 % MT SOLN
15.0000 mL | Freq: Once | OROMUCOSAL | Status: AC
  Administered 2024-04-08: 15 mL via OROMUCOSAL

## 2024-04-08 MED ORDER — OXYCODONE HCL 5 MG PO TABS
5.0000 mg | ORAL_TABLET | ORAL | Status: DC | PRN
  Administered 2024-04-08 – 2024-04-09 (×2): 10 mg via ORAL
  Filled 2024-04-08 (×2): qty 2

## 2024-04-08 MED ORDER — ROCURONIUM BROMIDE 10 MG/ML (PF) SYRINGE
PREFILLED_SYRINGE | INTRAVENOUS | Status: DC | PRN
  Administered 2024-04-08: 10 mg via INTRAVENOUS
  Administered 2024-04-08: 50 mg via INTRAVENOUS
  Administered 2024-04-08: 20 mg via INTRAVENOUS

## 2024-04-08 MED ORDER — LIDOCAINE HCL (PF) 2 % IJ SOLN
INTRAMUSCULAR | Status: DC | PRN
  Administered 2024-04-08: 60 mg via INTRADERMAL

## 2024-04-08 MED ORDER — PROPOFOL 10 MG/ML IV BOLUS
INTRAVENOUS | Status: AC
  Filled 2024-04-08: qty 20

## 2024-04-08 MED ORDER — ACETAMINOPHEN 500 MG PO TABS: 1000.0000 mg | ORAL_TABLET | Freq: Once | ORAL | Status: DC

## 2024-04-08 MED ORDER — ONDANSETRON 4 MG PO TBDP: 4.0000 mg | ORAL_TABLET | Freq: Four times a day (QID) | ORAL | Status: DC | PRN

## 2024-04-08 MED ORDER — HYDROMORPHONE HCL 1 MG/ML IJ SOLN: 0.2500 mg | INTRAMUSCULAR | Status: DC | PRN

## 2024-04-08 MED ORDER — SPY AGENT GREEN - (INDOCYANINE FOR INJECTION)
2.5000 mg | Freq: Once | INTRAMUSCULAR | Status: AC
  Administered 2024-04-08: 2.5 mg via INTRAVENOUS

## 2024-04-08 MED ORDER — PROPOFOL 10 MG/ML IV BOLUS
INTRAVENOUS | Status: DC | PRN
  Administered 2024-04-08: 50 mg via INTRAVENOUS
  Administered 2024-04-08: 120 mg via INTRAVENOUS
  Administered 2024-04-08: 30 mg via INTRAVENOUS

## 2024-04-08 MED ORDER — ONDANSETRON HCL 4 MG/2ML IJ SOLN: 4.0000 mg | Freq: Four times a day (QID) | INTRAMUSCULAR | Status: DC | PRN

## 2024-04-08 MED ORDER — LABETALOL HCL 5 MG/ML IV SOLN
INTRAVENOUS | Status: DC | PRN
  Administered 2024-04-08 (×2): 5 mg via INTRAVENOUS

## 2024-04-08 MED ORDER — HYDROMORPHONE HCL 1 MG/ML IJ SOLN: 0.5000 mg | INTRAMUSCULAR | Status: DC | PRN

## 2024-04-08 MED ORDER — ORAL CARE MOUTH RINSE: 15.0000 mL | Freq: Once | OROMUCOSAL | Status: AC

## 2024-04-08 MED ORDER — CHLORHEXIDINE GLUCONATE 0.12 % MT SOLN
OROMUCOSAL | Status: AC
  Filled 2024-04-08: qty 15

## 2024-04-08 MED ORDER — KETOROLAC TROMETHAMINE 30 MG/ML IJ SOLN
INTRAMUSCULAR | Status: DC | PRN
  Administered 2024-04-08: 30 mg via INTRAVENOUS

## 2024-04-08 MED ORDER — ONDANSETRON HCL 4 MG/2ML IJ SOLN
INTRAMUSCULAR | Status: DC | PRN
  Administered 2024-04-08: 4 mg via INTRAVENOUS

## 2024-04-08 MED ORDER — ADULT MULTIVITAMIN W/MINERALS CH
1.0000 | ORAL_TABLET | Freq: Every morning | ORAL | Status: DC
  Filled 2024-04-08 (×2): qty 1

## 2024-04-08 MED ORDER — ENOXAPARIN SODIUM 40 MG/0.4ML IJ SOSY
40.0000 mg | PREFILLED_SYRINGE | INTRAMUSCULAR | Status: DC
  Administered 2024-04-09: 40 mg via SUBCUTANEOUS
  Filled 2024-04-08: qty 0.4

## 2024-04-08 MED ORDER — DOCUSATE SODIUM 100 MG PO CAPS
100.0000 mg | ORAL_CAPSULE | Freq: Two times a day (BID) | ORAL | Status: DC
  Administered 2024-04-08 – 2024-04-09 (×2): 100 mg via ORAL
  Filled 2024-04-08 (×2): qty 1

## 2024-04-08 SURGICAL SUPPLY — 79 items
BAG COUNTER SPONGE SURGICOUNT (BAG) ×1 IMPLANT
BIOPATCH RED 1 DISK 7.0 (GAUZE/BANDAGES/DRESSINGS) ×2 IMPLANT
BLADE CLIPPER SURG (BLADE) IMPLANT
BLADE SURG 10 STRL SS (BLADE) IMPLANT
CANISTER SUCTION 3000ML PPV (SUCTIONS) ×1 IMPLANT
CHLORAPREP W/TINT 26 (MISCELLANEOUS) ×1 IMPLANT
CLAMP SUTURE YELLOW 5 PAIRS (MISCELLANEOUS) IMPLANT
CLIP APPLIE 5 13 M/L LIGAMAX5 (MISCELLANEOUS) ×1 IMPLANT
CLIP TI MEDIUM 24 (CLIP) ×1 IMPLANT
CLIP TI WIDE RED SMALL 24 (CLIP) ×1 IMPLANT
CNTNR URN SCR LID CUP LEK RST (MISCELLANEOUS) IMPLANT
COVER SURGICAL LIGHT HANDLE (MISCELLANEOUS) ×1 IMPLANT
DERMABOND ADVANCED .7 DNX12 (GAUZE/BANDAGES/DRESSINGS) ×2 IMPLANT
DRAIN CHANNEL 19F RND (DRAIN) ×1 IMPLANT
DRAPE INCISE IOBAN 66X45 STRL (DRAPES) ×1 IMPLANT
DRAPE LAPAROSCOPIC ABDOMINAL (DRAPES) ×1 IMPLANT
DRAPE WARM FLUID 44X44 (DRAPES) ×1 IMPLANT
DRSG TEGADERM 4X4.75 (GAUZE/BANDAGES/DRESSINGS) ×2 IMPLANT
DRSG TELFA 3X8 NADH STRL (GAUZE/BANDAGES/DRESSINGS) IMPLANT
ELECT BLADE 6.5 EXT (BLADE) ×1 IMPLANT
ELECT CAUTERY BLADE 6.4 (BLADE) ×1 IMPLANT
ELECT PAD DSPR THERM+ ADLT (MISCELLANEOUS) ×1 IMPLANT
ELECTRODE REM PT RTRN 9FT ADLT (ELECTROSURGICAL) ×1 IMPLANT
EVACUATOR SILICONE 100CC (DRAIN) ×1 IMPLANT
GAUZE 4X4 16PLY ~~LOC~~+RFID DBL (SPONGE) IMPLANT
GLOVE BIOGEL PI IND STRL 6 (GLOVE) ×1 IMPLANT
GLOVE BIOGEL PI MICRO STRL 5.5 (GLOVE) ×2 IMPLANT
GOWN STRL REUS W/ TWL LRG LVL3 (GOWN DISPOSABLE) ×3 IMPLANT
GRASPER SUT TROCAR 14GX15 (MISCELLANEOUS) ×1 IMPLANT
HAND PENCIL TRP OPTION (MISCELLANEOUS) ×1 IMPLANT
HANDLE SUCTION POOLE (INSTRUMENTS) ×1 IMPLANT
HEMOSTAT HEMOBLAST BELLOWS (HEMOSTASIS) IMPLANT
HEMOSTAT SNOW SURGICEL 2X4 (HEMOSTASIS) ×1 IMPLANT
IRRIGATION SUCT STRKRFLW 2 WTP (MISCELLANEOUS) ×1 IMPLANT
KIT BASIN OR (CUSTOM PROCEDURE TRAY) ×1 IMPLANT
KIT IMAGING PINPOINTPAQ (MISCELLANEOUS) IMPLANT
KIT TURNOVER KIT B (KITS) ×1 IMPLANT
LHOOK LAP DISP 36CM (ELECTROSURGICAL) ×1 IMPLANT
LIGASURE IMPACT 36 18CM CVD LR (INSTRUMENTS) IMPLANT
NDL INSUFFLATION 14GA 120MM (NEEDLE) ×1 IMPLANT
PACK GENERAL/GYN (CUSTOM PROCEDURE TRAY) ×1 IMPLANT
PAD ARMBOARD POSITIONER FOAM (MISCELLANEOUS) ×1 IMPLANT
PENCIL BUTTON HOLSTER BLD 10FT (ELECTRODE) ×1 IMPLANT
PENCIL SMOKE EVACUATOR (MISCELLANEOUS) ×1 IMPLANT
POUCH RETRIEVAL ECOSAC 10 (ENDOMECHANICALS) IMPLANT
RETRACTOR WOUND ALXS 34CM XLRG (MISCELLANEOUS) IMPLANT
SCISSORS LAP 5X35 DISP (ENDOMECHANICALS) ×1 IMPLANT
SEALER BIPOLAR AQUA 6.0 (INSTRUMENTS) ×1 IMPLANT
SET TUBE SMOKE EVAC HIGH FLOW (TUBING) ×1 IMPLANT
SLEEVE Z-THREAD 5X100MM (TROCAR) ×2 IMPLANT
SOLN 0.9% NACL POUR BTL 1000ML (IV SOLUTION) ×2 IMPLANT
SOLN STERILE WATER BTL 1000 ML (IV SOLUTION) ×1 IMPLANT
SPONGE T-LAP 18X18 ~~LOC~~+RFID (SPONGE) ×1 IMPLANT
SUT CHROMIC 0 BP (SUTURE) ×2 IMPLANT
SUT CHROMIC 3 0 CT 36 (SUTURE) IMPLANT
SUT ETHILON 2 0 FS 18 (SUTURE) ×1 IMPLANT
SUT MNCRL AB 4-0 PS2 18 (SUTURE) ×1 IMPLANT
SUT PDS AB 1 TP1 96 (SUTURE) ×2 IMPLANT
SUT PDS AB 4-0 RB1 27 (SUTURE) IMPLANT
SUT PROLENE 2 0 SH DA (SUTURE) IMPLANT
SUT PROLENE 3 0 SH 48 (SUTURE) ×1 IMPLANT
SUT PROLENE 4-0 RB1 .5 CRCL 36 (SUTURE) ×2 IMPLANT
SUT SILK 2 0 SH (SUTURE) IMPLANT
SUT SILK 2 0 TIES 10X30 (SUTURE) ×1 IMPLANT
SUT SILK 2 0SH CR/8 30 (SUTURE) ×1 IMPLANT
SUT SILK 3 0 TIES 10X30 (SUTURE) IMPLANT
SUT SILK 3 0SH CR/8 30 (SUTURE) IMPLANT
SUT VIC AB 3-0 SH 27X BRD (SUTURE) ×1 IMPLANT
SUT VICRYL 0 UR6 27IN ABS (SUTURE) IMPLANT
SYR BULB IRRIG 60ML STRL (SYRINGE) ×1 IMPLANT
SYSTEM BAG RETRIEVAL 10MM (BASKET) IMPLANT
TOWEL GREEN STERILE (TOWEL DISPOSABLE) ×1 IMPLANT
TOWEL GREEN STERILE FF (TOWEL DISPOSABLE) ×1 IMPLANT
TRAY FOLEY MTR SLVR 14FR STAT (SET/KITS/TRAYS/PACK) ×1 IMPLANT
TRAY LAPAROSCOPIC MC (CUSTOM PROCEDURE TRAY) ×1 IMPLANT
TROCAR Z THREAD OPTICAL 12X100 (TROCAR) ×1 IMPLANT
TROCAR Z-THREAD OPTICAL 5X100M (TROCAR) ×1 IMPLANT
TUBE CONNECTING 12X1/4 (SUCTIONS) IMPLANT
WARMER LAPAROSCOPE (MISCELLANEOUS) ×1 IMPLANT

## 2024-04-08 NOTE — Transfer of Care (Signed)
 Immediate Anesthesia Transfer of Care Note  Patient: [name]  Procedure(s) Performed: LAPAROSCOPIC CHOLECYSTECTOMY HEPATECTOMY, PARTIAL, OPEN  Patient Location: PACU  Anesthesia Type:General  Level of Consciousness: awake, alert , and oriented  Airway & Oxygen Therapy: Patient Spontanous Breathing and Patient connected to nasal cannula oxygen  Post-op Assessment: Report given to RN, Post -op Vital signs reviewed and stable, Patient moving all extremities X 4, and Patient able to stick tongue midline  Post vital signs: Reviewed and stable  Last Vitals:  Vitals Value Taken Time  BP 132/88 04/08/24 15:45  Temp 37 C 04/08/24 15:45  Pulse 96 04/08/24 15:47  Resp 20 04/08/24 15:47  SpO2 100 % 04/08/24 15:47  Vitals shown include unfiled device data.  Last Pain:  Vitals:   04/08/24 1545  TempSrc:   PainSc: Asleep         Complications: No notable events documented.

## 2024-04-08 NOTE — Anesthesia Preprocedure Evaluation (Addendum)
 Anesthesia Evaluation  Patient identified by MRN, date of birth, ID band Patient awake    Reviewed: Allergy & Precautions, NPO status , Patient's Chart, lab work & pertinent test results, reviewed documented beta blocker date and time   History of Anesthesia Complications Negative for: history of anesthetic complications  Airway Mallampati: II  TM Distance: >3 FB     Dental no notable dental hx.    Pulmonary neg pulmonary ROS, neg COPD   breath sounds clear to auscultation       Cardiovascular (-) angina (-) CAD, (-) Past MI and (-) Cardiac Stents negative cardio ROS  Rhythm:Regular Rate:Normal     Neuro/Psych neg Seizures negative neurological ROS     GI/Hepatic negative GI ROS, Neg liver ROS,,,(+) neg Cirrhosis      IMPRESSION: 1. Mass-like thickening of the gallbladder with dilatation of the fundus, measuring 4.7 x 3.8 x 6.6 cm, with surrounding fluid and soft tissue stranding. If there are clinical signs or symptoms of infection, imaging findings may reflect the underlying . severe acute cholecystitis. Alternatively, in the absence of any signs or symptoms of infection, imaging findings are concerning for malignancy. Prompt surgical consultation is advised. 2. The abnormality within segment 4b reported on the CT is not confidently identified on the current exam, which may reflect lack of iv contrast material. 3. Fusiform dilatation of the proximal common bile duct up to 9 mm with mild to moderate intrahepatic ductal dilatation. No obstructing choledocholithiasis identified. 4. The urgent finding will be called to the ordering provider by the Professional Radiology Assistants (PRAs) and documented in the [facility] dashboard. .    Endo/Other  negative endocrine ROS    Renal/GU Renal diseasenegative Renal ROS     Musculoskeletal negative musculoskeletal ROS (+)    Abdominal   Peds  Hematology negative  hematology ROS (+)   Anesthesia Other Findings   Reproductive/Obstetrics                              Anesthesia Physical Anesthesia Plan  ASA: 2  Anesthesia Plan: General   Post-op Pain Management: Tylenol PO (pre-op)*   Induction: Intravenous  PONV Risk Score and Plan: 3 and Treatment may vary due to age or medical condition, Ondansetron, Dexamethasone, Midazolam and Scopolamine patch - Pre-op  Airway Management Planned: Oral ETT  Additional Equipment:   Intra-op Plan:   Post-operative Plan: Extubation in OR  Informed Consent: I have reviewed the patients History and Physical, chart, labs and discussed the procedure including the risks, benefits and alternatives for the proposed anesthesia with the patient or authorized representative who has indicated his/her understanding and acceptance.     Dental advisory given  Plan Discussed with: CRNA  Anesthesia Plan Comments:          Anesthesia Quick Evaluation

## 2024-04-08 NOTE — Interval H&P Note (Signed)
 History and Physical Interval Note:  04/08/2024 12:52 PM  [name]  has presented today for surgery, with the diagnosis of CHOLECYSTITIS GALLBLADDER MASS.  The various methods of treatment have been discussed with the patient and family. After consideration of risks, benefits and other options for treatment, the patient has consented to  Procedures: LAPAROSCOPIC CHOLECYSTECTOMY (N/A) HEPATECTOMY, PARTIAL, OPEN (N/A) as a surgical intervention.  The patient's history has been reviewed, patient examined, no change in status, stable for surgery.  I have reviewed the patient's chart and labs. Labs sent yesterday, including LFTs and CA19-9, are normal. Questions were answered to the patient's satisfaction.     Leonor LITTIE Dawn

## 2024-04-08 NOTE — Op Note (Signed)
 Date: 04/08/2024  Patient: [name] MRN: [id number]  Preoperative Diagnosis: Cholecystitis vs gallbladder mass Postoperative Diagnosis: Severe acute cholecystitis  Procedure:  Laparoscopic partial fenestrating cholecystectomy with fluorescence ICG cholangiography Liver biopsy  Surgeon: Leonor Dawn, MD Assistant: Lynda Leos, MD  EBL: Minimal  Anesthesia: General endotracheal  Specimens:  Gallbladder biopsy (for frozen) Gallbladder (partial) Liver nodule  Indications: [name] is a 48 yo female who presented with worsening RUQ abdominal pain over last several weeks. She was evaluated by her PCP and sent for a CT scan, which showed thickening of the gallbladder wall with surrounding fluid, suspicious for cholecystitis and possibly a neoplastic process. There was also an indeterminate lesion in segment 4b. A noncontrast MRI showed similar findings but did not show the liver lesion. The patient presented for a surgical consult yesterday and had severe pain with a palpable mass in the RUQ on exam. After a discussion of the risks and benefits of surgery, she consented to proceed with cholecystectomy, and possibly a radical cholecystectomy.   Findings: Severe acute cholecystitis with pus in the gallbladder. The body was fibrosed with obstruction of the lumen. The gallbladder wall felt firm and rubbery, and a frozen section showed necrosis without any definite malignant cells. The transverse colon and duodenum were very adherent and the cystic duct could not be identified, thus a partial cholecystectomy was performed. There was a small nodule in segment 4b corresponding to the preoperative CT scan, which was biopsied. No other liver lesions were noted.  Procedure details: Informed consent was obtained in the preoperative area prior to the procedure. The patient was brought to the operating room and placed on the table in the supine position. General anesthesia was induced and  appropriate lines and drains were placed for intraoperative monitoring. Perioperative antibiotics were administered per SCIP guidelines. The abdomen was prepped and draped in the usual sterile fashion. A pre-procedure timeout was taken verifying patient identity, surgical site and procedure to be performed.  A supraumbilical skin incision was made overlying the umbilical hernia defect, the subcutaneous tissue was divided with cautery, and the umbilical hernia sac was encountered.  The sac was dissected out bluntly and opened, and the fascial defect was extended superiorly using cautery.  A 12 mm Hassan trocar was placed and the abdomen was insufflated.  The peritoneal cavity was inspected with no evidence of visceral or vascular injury.  The gallbladder was markedly distended and the duodenum appeared adherent to the body and infundibulum.  A 5 mm port was placed in the subxiphoid area, followed by two additional 5mm ports in the right costal margin.  The surface of the liver was examined and there were no nodules visualized.  The peritoneal surface was normal in appearance without any nodules.  The gallbladder was retracted cephalad.  The transverse colon and the duodenum were adherent to the body of the gallbladder.  The colon was able to be partially swept down off the gallbladder using blunt dissection, however more inferiorly it was densely adherent.  More medially, the duodenum was partially separated from the gallbladder using sharp dissection.  More inferiorly, the duodenum was densely adherent.  Attempts were made to bluntly sweep the colon and the duodenum off the gallbladder, but there was dense fibrinous tissue and adhesions without a clear plane.  ICG was used to try to identify the location of the cystic duct, however the cystic triangle was not visible behind the duodenum.  The dome of the gallbladder was distended and tense, and ruptured  during retraction.  Copious purulent fluid drained from the  gallbladder.  At this point it was clear that we would not be able to separate the duodenum and the transverse colon from the gallbladder without creating significant injuries.  As the gallbladder had already been opened, we elected to further open the fundus and obtain a frozen section.  The fundus of the gallbladder was removed using cautery.  The lumen had extensive fibrinous and necrotic tissue, but no discrete mass lesions.  More inferiorly, the lumen appeared completely obliterated.  The tissue felt firm and rubbery, but not calcified.  A full thickness sample of the wall was taken and sent for frozen section.  On discussion with the pathologist, the tissue was completely necrotic, and did not appear consistent with tumor necrosis, however this was difficult to determine definitively on frozen section.  The clinical presentation and gross appearance of the gallbladder seemed most consistent with severe cholecystitis and not an invasive cancer.  I discussed the case with my hepatobiliary partner over the phone and we agreed that at this point the best course of action would be to perform a partial cholecystectomy instead of trying to perform a total cholecystectomy, which would risk significant injuries to the duodenum and the transverse colon.  The resected dome of the gallbladder was placed in an Endo Catch bag and removed and sent for routine pathology.  We could not remove any more of the gallbladder wall due to adherence of the colon and the duodenum.  There was no return of bile on examining the lumen of the gallbladder.  The liver was again examined and a small nodule was noted near the falciform ligament on segment 4B, in the umbilical fissure.  This was approximately 1 cm in size.  A biopsy forceps was used to sample the lesion.  On excision it felt soft.  The specimen was sent for routine pathology. Hemostasis was achieved at the biopsy site using cautery, and Surgicel snow was applied to the biopsy  site.  The surgical site was irrigated and appeared hemostatic.  A 19-Fr JP drain was placed adjacent to the gallbladder, brought out through the right lateral port site, and sutured to the skin with 2-0 nylon suture.  The ports were removed and the abdomen was desufflated.  The umbilical hernia sac was excised and discarded.  The umbilical port site fascia was closed with a 0 Vicryl figure-of-eight suture.  The skin at each port site was closed with 4-0 Monocryl subcuticular suture.  Dermabond was applied.  The patient tolerated the procedure well with no apparent complications.  All counts were correct x2 at the end of the procedure. The patient was extubated and taken to PACU in stable condition.  Leonor Dawn, MD 04/08/2024 3:54 PM

## 2024-04-08 NOTE — Discharge Instructions (Addendum)
 [facility] DISCHARGE INSTRUCTIONS  Activity No heavy lifting greater than 15 pounds for 6 weeks after surgery. Ok to shower in 24 hours, but do not bathe or submerge incisions underwater. Do not drive while taking narcotic pain medication. You may drive when you are no longer taking prescription pain medication, you can comfortably wear a seatbelt, and you can safely maneuver your car and apply brakes.  Wound Care Your incisions are covered with skin glue called Dermabond. This will peel off on its own over time. You may shower and allow warm soapy water to run over your incisions. Gently pat dry. Do not submerge your incision underwater until cleared by your surgeon. Monitor your incision for any new redness, tenderness, or drainage. Many patients will experience some swelling and bruising at the incisions.  Ice packs will help.  Swelling and bruising can take several days to resolve.   [facility] DRAIN CARE INSTRUCTIONS Wash your hands prior to caring for your drain. Uncap the bulb to release the suction. Pour out the bulb contents into a measuring cup and record the amount. Squeeze the bulb and replace the cap to apply suction again. You should empty your drain each time you notice the bulb is full. Empty at least once a day and more often when the bulb fills up.  Please keep a daily log of your drain output and bring this with you when you come to clinic.   Medications A  prescription for pain medication may be given to you upon discharge.  Take your pain medication as prescribed, if needed.  If narcotic pain medicine is not needed, then you may take acetaminophen (Tylenol) or ibuprofen (Advil) as needed. It is common to experience some constipation if taking pain medication after surgery.  Increasing fluid intake and taking a stool softener (such as Colace) will usually help or prevent this problem from occurring.  A mild laxative (Milk of Magnesia or Miralax) should be  taken according to package directions if there are no bowel movements after 48 hours. Take your usually prescribed medications unless otherwise directed. If you need a refill on your pain medication, please contact your pharmacy.  They will contact our office to request authorization. Prescriptions will not be filled after 5 pm or on weekends.  When to Call Us : Fever greater than 100.5 New redness, drainage, or swelling at incision site Severe pain, nausea, or vomiting Persistent bleeding from incisions Jaundice (yellowing of the whites of the eyes or skin) Change in the color of your drain output.  Follow-up You have an appointment scheduled with Dr. Dasie on April 19, 2024 at 2:40pm. This will be at the [facility] office at [address], [address], [place], [place]. Please arrive at least 15 minutes prior to your scheduled appointment time.  IF YOU HAVE DISABILITY OR FAMILY LEAVE FORMS, YOU MUST BRING THEM TO THE OFFICE FOR PROCESSING.   DO NOT GIVE THEM TO YOUR DOCTOR.  The clinic staff is available to answer your questions during regular business hours.  Please dont hesitate to call and ask to speak to one of the nurses for clinical concerns.  If you have a medical emergency, go to the nearest emergency room or call 911.  A surgeon from [facility] is always on call at the hospital  [address], [place], [place]  [address] ?  [address] [address], [place], [place]   [address] [phone] ? Toll Free: [phone] ? FAX [phone] Web site: [email or web address]      Managing Your Pain After Surgery Without Opioids    Thank you for participating in our program to help patients manage their pain after surgery without opioids. This is part of our effort to provide you with the best care possible, without exposing you or your family to the risk that opioids pose.  What pain can I expect after surgery? You can expect to  have some pain after surgery. This is normal. The pain is typically worse the day after surgery, and quickly begins to get better. Many studies have found that many patients are able to manage their pain after surgery with Over-the-Counter (OTC) medications such as Tylenol and Motrin. If you have a condition that does not allow you to take Tylenol or Motrin, notify your surgical team.  How will I manage my pain? The best strategy for controlling your pain after surgery is around the clock pain control with Tylenol (acetaminophen) and Motrin (ibuprofen or Advil). Alternating these medications with each other allows you to maximize your pain control. In addition to Tylenol and Motrin, you can use heating pads or ice packs on your incisions to help reduce your pain.  How will I alternate your regular strength over-the-counter pain medication? You will take a dose of pain medication every three hours. Start by taking 650 mg of Tylenol (2 pills of 325 mg) 3 hours later take 600 mg of Motrin (3 pills of 200 mg) 3 hours after taking the Motrin take 650 mg of Tylenol 3 hours after that take 600 mg of Motrin.   - 1 -  See example - if your first dose of Tylenol is at 12:00 PM   12:00 PM Tylenol 650 mg (2 pills of 325 mg)  3:00 PM Motrin 600 mg (3 pills of 200 mg)  6:00 PM Tylenol 650 mg (2 pills of 325 mg)  9:00 PM Motrin 600 mg (3 pills of 200 mg)  Continue alternating every 3 hours   We recommend that you follow this schedule around-the-clock for at least 3 days after surgery, or until you feel that it is no longer needed. Use the table on the last page of this handout to keep track of the medications you are taking. Important: Do not take more than 3000mg  of Tylenol or 3200mg  of Motrin in a 24-hour period. Do not take ibuprofen/Motrin if you have a history of bleeding stomach ulcers, severe kidney disease, &/or actively taking a blood thinner  What if I still have pain? If you have pain that  is not controlled with the over-the-counter pain medications (Tylenol and Motrin or Advil) you might have what we call breakthrough pain. You will receive a prescription for a small amount of an opioid pain medication such as Oxycodone, Tramadol, or Tylenol with Codeine. Use these opioid pills in the first 24 hours after surgery if you have breakthrough pain. Do not take more than 1 pill every 4-6 hours.  If you still have uncontrolled pain after using all opioid pills, don't hesitate to call our staff using the number provided. We will help make sure you are managing your pain in the best way possible, and if necessary, we can provide a prescription for additional pain medication.   Day 1    Time  Name of Medication Number of pills taken  Amount of Acetaminophen  Pain Level   Comments  AM PM       AM PM  AM PM       AM PM       AM PM       AM PM       AM PM       AM PM       Total Daily amount of Acetaminophen Do not take more than  3,000 mg per day      Day 2    Time  Name of Medication Number of pills taken  Amount of Acetaminophen  Pain Level   Comments  AM PM       AM PM       AM PM       AM PM       AM PM       AM PM       AM PM       AM PM       Total Daily amount of Acetaminophen Do not take more than  3,000 mg per day      Day 3    Time  Name of Medication Number of pills taken  Amount of Acetaminophen  Pain Level   Comments  AM PM       AM PM       AM PM       AM PM         AM PM       AM PM       AM PM       AM PM       Total Daily amount of Acetaminophen Do not take more than  3,000 mg per day      Day 4    Time  Name of Medication Number of pills taken  Amount of Acetaminophen  Pain Level   Comments  AM PM       AM PM       AM PM       AM PM       AM PM       AM PM       AM PM       AM PM       Total Daily amount of Acetaminophen Do not take more than  3,000 mg per day      Day 5    Time  Name of Medication  Number of pills taken  Amount of Acetaminophen  Pain Level   Comments  AM PM       AM PM       AM PM       AM PM       AM PM       AM PM       AM PM       AM PM       Total Daily amount of Acetaminophen Do not take more than  3,000 mg per day      Day 6    Time  Name of Medication Number of pills taken  Amount of Acetaminophen  Pain Level  Comments  AM PM       AM PM       AM PM       AM PM       AM PM       AM PM       AM PM       AM PM       Total Daily amount of Acetaminophen  Do not take more than  3,000 mg per day      Day 7    Time  Name of Medication Number of pills taken  Amount of Acetaminophen  Pain Level   Comments  AM PM       AM PM       AM PM       AM PM       AM PM       AM PM       AM PM       AM PM       Total Daily amount of Acetaminophen Do not take more than  3,000 mg per day        For additional information about how and where to safely dispose of unused opioid medications - prankcrew.uy  Disclaimer: This document contains information and/or instructional materials adapted from [facility] for the typical patient with your condition. It does not replace medical advice from your health care provider because your experience may differ from that of the typical patient. Talk to your health care provider if you have any questions about this document, your condition or your treatment plan. Adapted from [facility]

## 2024-04-08 NOTE — Anesthesia Procedure Notes (Signed)
 Procedure Name: Intubation Date/Time: 04/08/2024 2:15 PM  Performed by: Harrold Macintosh, CRNAPre-anesthesia Checklist: Patient identified, Emergency Drugs available, Suction available and Patient being monitored Patient Re-evaluated:Patient Re-evaluated prior to induction Oxygen Delivery Method: Circle system utilized Preoxygenation: Pre-oxygenation with 100% oxygen Induction Type: IV induction Ventilation: Mask ventilation without difficulty Laryngoscope Size: Miller and 2 Grade View: Grade II Tube type: Oral Tube size: 7.0 mm Number of attempts: 1 Airway Equipment and Method: Stylet and Bite block Placement Confirmation: ETT inserted through vocal cords under direct vision, positive ETCO2 and breath sounds checked- equal and bilateral Secured at: 21 cm Tube secured with: Tape Dental Injury: Teeth and Oropharynx as per pre-operative assessment

## 2024-04-09 LAB — BASIC METABOLIC PANEL WITH GFR
Anion gap: 12 (ref 5–15)
BUN: 14 mg/dL (ref 6–20)
CO2: 20 mmol/L — ABNORMAL LOW (ref 22–32)
Calcium: 8.7 mg/dL — ABNORMAL LOW (ref 8.9–10.3)
Chloride: 102 mmol/L (ref 98–111)
Creatinine, Ser: 0.78 mg/dL (ref 0.44–1.00)
GFR, Estimated: >60 mL/min (ref >60)
Glucose, Bld: 143 mg/dL — ABNORMAL HIGH (ref 70–99)
Potassium: 4 mmol/L (ref 3.5–5.1)
Sodium: 134 mmol/L — ABNORMAL LOW (ref 135–145)

## 2024-04-09 LAB — CBC
HCT: 34.1 % — ABNORMAL LOW (ref 36.0–46.0)
Hemoglobin: 12 g/dL (ref 12.0–15.0)
MCH: 30.9 pg (ref 26.0–34.0)
MCHC: 35.2 g/dL (ref 30.0–36.0)
MCV: 87.9 fL (ref 80.0–100.0)
Platelets: 252 K/uL (ref 150–400)
RBC: 3.88 MIL/uL (ref 3.87–5.11)
RDW: 12.9 % (ref 11.5–15.5)
WBC: 10.1 K/uL (ref 4.0–10.5)
nRBC: 0 % (ref 0.0–0.2)

## 2024-04-09 NOTE — Progress Notes (Signed)
 [name] to be D/C'd  per MD order.  Discussed with the patient and all questions fully answered.  VSS, Skin clean, dry and intact without evidence of skin break down, no evidence of skin tears noted.  IV catheter discontinued intact. Site without signs and symptoms of complications. Dressing and pressure applied.  An After Visit Summary was printed and given to the patient. Patient instructed to pick up medication from preferred pharmacy.  D/c education completed with patient/family including follow up instructions, medication list, d/c activities limitations if indicated, with other d/c instructions as indicated by MD - patient able to verbalize understanding, all questions fully answered. Reinforced teaching for jp drain emptying, logging output and monitoring for any signs of infection. Patient verbalized understanding. Patient received return work note.   Patient instructed to return to ED, call 911, or call MD for any changes in condition.   Patient to be escorted via [facility] by family, and D/C home via private auto.

## 2024-04-09 NOTE — Plan of Care (Signed)

## 2024-04-09 NOTE — Progress Notes (Cosign Needed)
 Progress Note  1 Day Post-Op  Subjective: Patient reports pain is manageable. Denies worsening pain. No BM since surgery but having flatulence. Denies n/v. Tolerating PO diet.  Feeling more comfortable and ready to be home.   ROS  All negative with the exception of above.  Objective: Vital signs in last 24 hours: Temp:  [97.4 F (36.3 C)-98.6 F (37 C)] 98.2 F (36.8 C) (12/13 0631) Pulse Rate:  [63-118] 63 (12/13 0631) Resp:  [16-23] 16 (12/13 0631) BP: (111-135)/(77-92) 123/85 (12/13 0631) SpO2:  [92 %-100 %] 98 % (12/13 0011) Weight:  [56.7 kg] 56.7 kg (12/12 1218) Last BM Date : 04/07/24  Intake/Output from previous day: 12/12 0701 - 12/13 0700 In: 1050 [I.V.:850; IV Piggyback:200] Out: 160 [Urine:50; Drains:80; Blood:30] Intake/Output this shift: No intake/output data recorded.  PE: General: Pleasant female who is laying in bed in NAD. HEENT: Head is normocephalic, atraumatic. Sclera are noninjected.  Heart: HR normal during encounter.   Lungs: Respiratory effort nonlabored on room air. Abd: Soft, ND. Appropriate generalized tenderness to palpation. Incisions with dermabond C/D/I. Right drain present with SS fluid. No rebound tenderness or guarding.  MS: Able to move all 4 extremities. Skin: Warm and dry.  Psych: A&Ox3 with an appropriate affect.    Lab Results:  Recent Labs    04/09/24 0428  WBC 10.1  HGB 12.0  HCT 34.1*  PLT 252   BMET Recent Labs    04/09/24 0428  NA 134*  K 4.0  CL 102  CO2 20*  GLUCOSE 143*  BUN 14  CREATININE 0.78  CALCIUM 8.7*   PT/INR No results for input(s): LABPROT, INR in the last 72 hours. CMP     Component Value Date/Time   NA 134 (L) 04/09/2024 0428   K 4.0 04/09/2024 0428   CL 102 04/09/2024 0428   CO2 20 (L) 04/09/2024 0428   GLUCOSE 143 (H) 04/09/2024 0428   BUN 14 04/09/2024 0428   CREATININE 0.78 04/09/2024 0428   CALCIUM 8.7 (L) 04/09/2024 0428   PROT 8.3 03/28/2024 1356   ALBUMIN 4.3  03/28/2024 1356   AST 18 03/28/2024 1356   ALT 13 03/28/2024 1356   ALKPHOS 89 03/28/2024 1356   BILITOT 0.4 03/28/2024 1356   GFRNONAA >60 04/09/2024 0428   GFRAA  09/23/2008 1123    >60        The eGFR has been calculated using the MDRD equation. This calculation has not been validated in all clinical situations. eGFR's persistently <60 mL/min signify possible Chronic Kidney Disease.   Lipase  No results found for: LIPASE     Studies/Results: No results found.  Anti-infectives: Anti-infectives (From admission, onward)    Start     Dose/Rate Route Frequency Ordered Stop   04/08/24 1215  ceFAZolin  (ANCEF ) IVPB 2g/100 mL premix        2 g 200 mL/hr over 30 Minutes Intravenous On call to O.R. 04/08/24 1211 04/08/24 1447        Assessment/Plan POD1: S/P the following by Dr. Dasie on 04/08/2024: Laparoscopic partial fenestrating cholecystectomy with fluorescence ICG cholangiography Liver biopsy -Afebrile. -WBC 10.1 from 9.6 and HGB 12.0 from 13.3 -Tolerating regular diet without n/v. Pain tolerable. Having flatus. No BM yet. -Drain with SS fluid on exam. 80 mL output recorded from 12/12-12/13. -Patient feeling better overall since yesterday and comfortable with discharge today. Will provide work physicist, medical. Patient is aware of outpatient follow up that has been arranged with Dr. Dasie. She will call  to confirm appointment. All questions answered.  FEN: Regular VTE: Lovenox  ID: [id number] [email or web address]     LOS: 0 days   I reviewed Op notes and previous H&P notes, nursing notes, last 24 h vitals and pain scores, last 48 h intake and output, last 24 h labs and trends, and last 24 h imaging results.   Marjorie Carlyon Favre, [facility] 04/09/2024, 8:26 AM Please see Amion for pager number during day hours 7:00am-4:30pm

## 2024-04-09 NOTE — Plan of Care (Signed)

## 2024-04-11 ENCOUNTER — Encounter (HOSPITAL_COMMUNITY): Payer: Self-pay | Admitting: Surgery

## 2024-04-11 NOTE — Anesthesia Postprocedure Evaluation (Signed)
 Anesthesia Post Note  Patient: [name]  Procedure(s) Performed: LAPAROSCOPIC CHOLECYSTECTOMY HEPATECTOMY, PARTIAL, OPEN     Patient location during evaluation: PACU Anesthesia Type: General Level of consciousness: awake and alert Pain management: pain level controlled Vital Signs Assessment: post-procedure vital signs reviewed and stable Respiratory status: spontaneous breathing, nonlabored ventilation, respiratory function stable and patient connected to nasal cannula oxygen Cardiovascular status: blood pressure returned to baseline and stable Postop Assessment: no apparent nausea or vomiting Anesthetic complications: no   No notable events documented.  Last Vitals:  Vitals:   04/09/24 0011 04/09/24 0631  BP: 111/77 123/85  Pulse: 72 63  Resp: 16 16  Temp: 36.7 C 36.8 C  SpO2: 98%     Last Pain:  Vitals:   04/09/24 0935  TempSrc:   PainSc: 8                  [name]

## 2024-04-12 LAB — TYPE AND SCREEN
ABO/RH(D): O POS
Antibody Screen: NEGATIVE
Unit division: 0
Unit division: 0

## 2024-04-12 LAB — BPAM RBC
Blood Product Expiration Date: 202601052359
Blood Product Expiration Date: 202601062359
ISSUE DATE / TIME: 202512121355
ISSUE DATE / TIME: 202512121355
Unit Type and Rh: 5100
Unit Type and Rh: 5100

## 2024-04-12 NOTE — Discharge Summary (Signed)
 [facility] Discharge Summary   Patient ID: [name] MRN: [id number] DOB/AGE: December 27, 1975 48 y.o.  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  Procedures Dr. Leonor Dawn, MD (04/08/24) - Laparoscopic partial fenestrating cholecystectomy with fluorescence ICG cholangiography; Liver biopsy  Hospital Course:  [name] is a 48 year old female who presented with worsening RUQ abdominal pain over the last several weeks. She was evaluated by her PCP and sent for CT imaging that showed thickening of the gallbladder wall with surrounding fluid, suspicious for cholecystitis and possibly a neoplastic process. There was also an indeterminate lesion in segment 4b. A noncontrast MRI showed similar findings but did not show the liver lesion. Patient was admitted and underwent procedure listed above. Tolerated procedure well and was transferred to the floor.  Diet was advanced as tolerated. On POD1, the patient was voiding well, tolerating diet, ambulating well, pain well controlled, vital signs stable, incisions c/d/i and felt stable for discharge home. Patient will follow up in our office. She will call to confirm appointment date/time.    Physical Exam: General: Pleasant female who is laying in bed in NAD. HEENT: Head is normocephalic, atraumatic. Sclera are noninjected.  Heart: HR normal during encounter.   Lungs: Respiratory effort nonlabored on room air. Abd: Soft, ND. Appropriate generalized tenderness to palpation. Incisions with dermabond C/D/I. Right drain present with SS fluid. No rebound tenderness or guarding.  MS: Able to move all 4 extremities. Skin: Warm and dry.  Psych: A&Ox3 with  an appropriate affect.   I or a member of my team have reviewed this patient in the Controlled Substance Database.   Allergies as of 04/09/2024       Reactions   Shellfish Allergy Other (See Comments)   Throat gets a little itchy        Medication List     TAKE these medications    acetaminophen  500 MG tablet Commonly known as: TYLENOL  Take 500-1,000 mg by mouth every 6 (six) hours as needed (pain.).   folic acid 1 MG tablet Commonly known as: FOLVITE Take 1 mg by mouth at bedtime.   ibuprofen 200 MG tablet Commonly known as: ADVIL Take 200-400 mg by mouth every 8 (eight) hours as needed (pain.).   Junel FE 1/20 1-20 MG-MCG tablet Generic drug: norethindrone-ethinyl estradiol-FE Take 1 tablet by mouth at bedtime.   multivitamin with minerals Tabs tablet Take 1 tablet by mouth in the morning.   oxyCODONE  5 MG immediate release tablet Commonly known as: Roxicodone  Take 1 tablet (5 mg total) by mouth every 6 (six) hours as needed for up to 5 days for severe pain (pain score 7-10).          Follow-up Information     Dawn Leonor CROME, MD. Call.   Specialty: General Surgery Why: Please call to confirm you follow up visit that is being scheduled for you. Contact information: [address] [address] [place] [address] [phone]                 Signed: Marjorie Carlyon Edmundo DEVONNA [facility] Surgery 04/12/2024, 12:57 PM Please see Amion for pager number during day hours 7:00am-4:30pm

## 2024-04-14 ENCOUNTER — Other Ambulatory Visit: Payer: Self-pay

## 2024-04-14 DIAGNOSIS — Z9049 Acquired absence of other specified parts of digestive tract: Secondary | ICD-10-CM

## 2024-04-15 ENCOUNTER — Inpatient Hospital Stay: Attending: Hematology | Admitting: Hematology

## 2024-04-15 ENCOUNTER — Inpatient Hospital Stay

## 2024-04-15 ENCOUNTER — Inpatient Hospital Stay: Admitting: Licensed Clinical Social Worker

## 2024-04-15 ENCOUNTER — Encounter: Payer: Self-pay | Admitting: Hematology

## 2024-04-15 VITALS — BP 128/95 | HR 117 | Temp 97.7°F | Resp 17 | Wt 126.4 lb

## 2024-04-15 DIAGNOSIS — Z8349 Family history of other endocrine, nutritional and metabolic diseases: Secondary | ICD-10-CM | POA: Insufficient documentation

## 2024-04-15 DIAGNOSIS — C23 Malignant neoplasm of gallbladder: Secondary | ICD-10-CM | POA: Diagnosis not present

## 2024-04-15 DIAGNOSIS — Z803 Family history of malignant neoplasm of breast: Secondary | ICD-10-CM | POA: Diagnosis not present

## 2024-04-15 DIAGNOSIS — Z8249 Family history of ischemic heart disease and other diseases of the circulatory system: Secondary | ICD-10-CM | POA: Insufficient documentation

## 2024-04-15 DIAGNOSIS — C787 Secondary malignant neoplasm of liver and intrahepatic bile duct: Secondary | ICD-10-CM | POA: Insufficient documentation

## 2024-04-15 DIAGNOSIS — Z83438 Family history of other disorder of lipoprotein metabolism and other lipidemia: Secondary | ICD-10-CM | POA: Diagnosis not present

## 2024-04-15 DIAGNOSIS — F32A Depression, unspecified: Secondary | ICD-10-CM | POA: Insufficient documentation

## 2024-04-15 DIAGNOSIS — Z9049 Acquired absence of other specified parts of digestive tract: Secondary | ICD-10-CM | POA: Diagnosis not present

## 2024-04-15 LAB — COMPREHENSIVE METABOLIC PANEL WITH GFR
ALT: 33 U/L (ref 0–44)
AST: 32 U/L (ref 15–41)
Albumin: 4.3 g/dL (ref 3.5–5.0)
Alkaline Phosphatase: 97 U/L (ref 38–126)
Anion gap: 14 (ref 5–15)
BUN: 11 mg/dL (ref 6–20)
CO2: 23 mmol/L (ref 22–32)
Calcium: 9.5 mg/dL (ref 8.9–10.3)
Chloride: 100 mmol/L (ref 98–111)
Creatinine, Ser: 0.82 mg/dL (ref 0.44–1.00)
GFR, Estimated: >60 mL/min
Glucose, Bld: 97 mg/dL (ref 70–99)
Potassium: 3.8 mmol/L (ref 3.5–5.1)
Sodium: 137 mmol/L (ref 135–145)
Total Bilirubin: 0.4 mg/dL (ref 0.0–1.2)
Total Protein: 8.5 g/dL — ABNORMAL HIGH (ref 6.5–8.1)

## 2024-04-15 LAB — PREGNANCY, URINE: Preg Test, Ur: NEGATIVE

## 2024-04-15 MED ORDER — ONDANSETRON HCL 8 MG PO TABS: 8.0000 mg | ORAL_TABLET | Freq: Three times a day (TID) | ORAL | 1 refills | Status: DC | PRN

## 2024-04-15 MED ORDER — PROCHLORPERAZINE MALEATE 10 MG PO TABS: 10.0000 mg | ORAL_TABLET | Freq: Four times a day (QID) | ORAL | 1 refills | Status: DC | PRN

## 2024-04-15 MED ORDER — LIDOCAINE-PRILOCAINE 2.5-2.5 % EX CREA: TOPICAL_CREAM | CUTANEOUS | 3 refills | Status: DC

## 2024-04-15 NOTE — Progress Notes (Signed)
 [facility] Clinical Social Work  Initial Assessment   [name] is a 48 y.o. year old female accompanied by patient, friend, and sister. Clinical Social Work was referred by medical provider for assessment of psychosocial needs.   SDOH (Social Determinants of Health) assessments performed: Yes   SDOH Screenings   Food Insecurity: No Food Insecurity (04/08/2024)  Housing: Low Risk (04/08/2024)  Transportation Needs: No Transportation Needs (04/08/2024)  Utilities: Not At Risk (04/08/2024)  Depression (PHQ2-9): Low Risk (04/15/2024)  Social Connections: Socially Integrated (04/08/2024)  Tobacco Use: Low Risk (04/15/2024)    PHQ 2/9:    04/15/2024    2:04 PM 05/18/2019    3:03 PM 12/18/2017    5:11 PM  Depression screen PHQ 2/9  Decreased Interest 0 0 0  Down, Depressed, Hopeless 0 0 0  PHQ - 2 Score 0 0 0     Distress Screen completed: No     No data to display            Family/Social Information:  Housing Arrangement: patient lives with her partner and 87 y/o daughter. Family members/support persons in your life? Pt states she has family and friends locally who will assist her as needed. 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  Income source: Employment Financial concerns: No Type of concern: None Food access concerns: no Religious or spiritual practice: Not known Advanced directives: No Services Currently in place:  none  Coping/ Adjustment to diagnosis: Patient understands treatment plan and what happens next? Pt is s/p surgery and seeing medical oncologist for the first time today to find out the treatment plan. Concerns about diagnosis and/or treatment: Overwhelmed by information and Afraid of cancer Patient reported stressors: Anxiety/ nervousness and Adjusting to my illness Hopes and/or priorities: pt's priority is to start treatment w/ the hope of positive results Patient enjoys time  with family/ friends Current coping skills/ strengths: Capable of independent living , Manufacturing systems engineer , Motivation for treatment/growth , Physical Health , and Supportive family/friends     SUMMARY: Current SDOH Barriers:  No barriers identified at this time.  Clinical Social Work Clinical Goal(s):  No clinical social work goals at this time  Interventions: Discussed common feeling and emotions when being diagnosed with cancer, and the importance of support during treatment Informed patient of the support team roles and support services at [facility] Provided CSW contact information and encouraged patient to call with any questions or concerns   Follow Up Plan: Patient will contact CSW with any support or resource needs Patient verbalizes understanding of plan: Yes    Devere JONELLE Manna, LCSW Clinical Social Worker [facility]

## 2024-04-15 NOTE — Progress Notes (Signed)
 PATIENT NAVIGATOR PROGRESS NOTE  Name: [name] Date: 04/15/2024 MRN: [id number]  DOB: January 01, 1976   Reason for visit:  Initial visit with Dr. Onita Mattock  Comments:   Patient was seen during her initial visit with Dr. Onita Mattock.  Patient was accompanied by her sister and a friend.  Patient was given a Journey's binder containing informaiton about her diagnosis.  Patient was also given my direct contact information and was instructed to contact office with any quesitons or concerns.  Patient verbalized understanding.    Patient reports 10lb weight loss within last month. Nutrition referral entered.   Time spent counseling/coordinating care: > 60 minutes

## 2024-04-15 NOTE — Progress Notes (Signed)
 START ON PATHWAY REGIMEN - Hepatobiliary     Cycles 1 through up to 8: A cycle is every 21 days:     Durvalumab      Gemcitabine      Cisplatin    Cycles 9 and beyond: A cycle is every 28 days:     Durvalumab   **Always confirm dose/schedule in your pharmacy ordering system**  Patient Characteristics: Cholangiocarcinoma, Unresectable/Metastatic, Systemic Therapy, First Line Hepatobiliary Disease Type: Cholangiocarcinoma Line of therapy: First Line Intent of Therapy: Non-Curative / Palliative Intent, Discussed with Patient

## 2024-04-15 NOTE — Progress Notes (Signed)
 " [facility]   [phone] [phone]   Clinic New Consult Note   Patient Care Team: Charlett Apolinar POUR, MD as PCP - General (Internal Medicine) 04/15/2024  CHIEF COMPLAINTS/PURPOSE OF CONSULTATION:  Newly diagnosed gallbladder cancer  REFERRING PHYSICIAN: Dr. Dasie  Discussed the use of AI scribe software for clinical note transcription with the patient, who gave verbal consent to proceed.  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  She was accompanied by her sister and friend to the clinic today.  She developed persistent right upper quadrant abdominal pain radiating to the lower back on March 21, 2024, described as a knot, which caused decreased oral intake and a 19-pound unintentional weight loss before surgery. She denied prior similar pain, fever, or chills.   Imaging showed an inflamed gallbladder,  and a 1.2 cm hepatic lesion.  The liver lesion was not seen on the subsequent MRI scan.  She was referred to general surgeon Dr. Dasie, and underwent laparoscopic partial cholecystectomy and liver biopsy on April 08, 2024, with postoperative drain placement.  The visible liver nodule was biopsied and removed during that surgery.  She now has mild surgical site tenderness controlled with acetaminophen  and ibuprofen and has not needed narcotics for three days. Drain output is steadily decreasing, with less than 4 ounces yesterday and less than 50 mL overnight. She is scheduled for surgical follow-up next week for anticipated drain removal.  Her appetite has improved and she is tolerating oral intake with better energy and increased urination. She denies fever, chills, or new gastrointestinal symptoms. She remains active with independent ambulation and self-care  and is focused on regaining weight and strength before starting chemotherapy.  She has discussed her diagnosis and treatment plan with her family.     MEDICAL HISTORY:  Past Medical History:  Diagnosis Date   Allergy     SURGICAL HISTORY: Past Surgical History:  Procedure Laterality Date   CESAREAN SECTION     CHOLECYSTECTOMY N/A 04/08/2024   Procedure: LAPAROSCOPIC CHOLECYSTECTOMY;  Surgeon: Dasie Leonor CROME, MD;  Location: [facility] OR;  Service: General;  Laterality: N/A;   MOLE REMOVAL     OPEN PARTIAL HEPATECTOMY  N/A 04/08/2024   Procedure: HEPATECTOMY, PARTIAL, OPEN;  Surgeon: Dasie Leonor CROME, MD;  Location: [facility] OR;  Service: General;  Laterality: N/A;    SOCIAL HISTORY: Social History   Socioeconomic History   Marital status: Significant Other    Spouse name: Not on file   Number of children: 1   Years of education: Not on file   Highest education level: Not on file  Occupational History   Not on file  Tobacco Use   Smoking status: Never   Smokeless tobacco: Never  Vaping Use   Vaping status: Never Used  Substance and Sexual Activity   Alcohol use: Not Currently   Drug use: No   Sexual activity: Not on file  Other Topics Concern   Not on file  Social History Narrative   Works full time as a customer service manager for a therapist, 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   8+ hours of sleep per night   1 daughter (six years of age) hh of 3       Neg td  ocass etoh    Exercise .   Social Drivers of Health   Tobacco  Use: Low Risk (04/15/2024)   Patient History    Smoking Tobacco Use: Never    Smokeless Tobacco Use: Never    Passive Exposure: Not on file  Financial Resource Strain: Not on file  Food Insecurity: No Food Insecurity (04/08/2024)   Epic    Worried About Programme Researcher, Broadcasting/film/video in the Last Year: Never true    Ran Out of Food in the Last Year: Never true  Transportation Needs: No Transportation Needs (04/08/2024)   Epic    Lack of Transportation  (Medical): No    Lack of Transportation (Non-Medical): No  Physical Activity: Not on file  Stress: Not on file  Social Connections: Socially Integrated (04/08/2024)   Social Connection and Isolation Panel    Frequency of Communication with Friends and Family: More than three times a week    Frequency of Social Gatherings with Friends and Family: More than three times a week    Attends Religious Services: More than 4 times per year    Active Member of [employer] or Organizations: Yes    Attends Banker Meetings: More than 4 times per year    Marital Status: Married  Catering Manager Violence: Not At Risk (04/08/2024)   Epic    Fear of Current or Ex-Partner: No    Emotionally Abused: No    Physically Abused: No    Sexually Abused: No  Depression (PHQ2-9): Low Risk (04/15/2024)   Depression (PHQ2-9)    PHQ-2 Score: 0  Alcohol Screen: Not on file  Housing: Low Risk (04/08/2024)   Epic    Unable to Pay for Housing in the Last Year: No    Number of Times Moved in the Last Year: 0    Homeless in the Last Year: No  Utilities: Not At Risk (04/08/2024)   Epic    Threatened with loss of utilities: No  Health Literacy: Not on file    FAMILY HISTORY: Family History  Problem Relation Age of Onset   Cancer Mother 1       breast cancer   Hyperlipidemia Brother        Big Brother   Hyperlipidemia Brother        Little Brother   Hypertension Brother        also blocked artery ? opened up.   Cancer Maternal Grandmother        breast cancer   Breast cancer Maternal Grandmother     ALLERGIES:  is allergic to shellfish allergy.  MEDICATIONS:  Current Outpatient Medications  Medication Sig Dispense Refill   acetaminophen  (TYLENOL ) 500 MG tablet Take 500-1,000 mg by mouth every 6 (six) hours as needed (pain.).     folic acid (FOLVITE) 1 MG tablet Take 1 mg by mouth at bedtime.  3   ibuprofen (ADVIL) 200 MG tablet Take 200-400 mg by mouth every 8 (eight) hours as needed  (pain.).     JUNEL FE 1/20 1-20 MG-MCG tablet Take 1 tablet by mouth at bedtime.     Multiple Vitamin (MULTIVITAMIN WITH MINERALS) TABS tablet Take 1 tablet by mouth in the morning.     lidocaine -prilocaine  (EMLA ) cream Apply to affected area once 30 g 3   ondansetron  (ZOFRAN ) 8 MG tablet Take 1 tablet (8 mg total) by mouth every 8 (eight) hours as needed for nausea or vomiting. Start on the third day after cisplatin. 30 tablet 1   prochlorperazine  (COMPAZINE ) 10 MG tablet Take 1 tablet (10 mg total) by mouth every 6 (six)  hours as needed (Nausea or vomiting). 30 tablet 1   No current facility-administered medications for this visit.    REVIEW OF SYSTEMS:   Constitutional: Denies fevers, chills or abnormal night sweats Eyes: Denies blurriness of vision, double vision or watery eyes Ears, nose, mouth, throat, and face: Denies mucositis or sore throat Respiratory: Denies cough, dyspnea or wheezes Cardiovascular: Denies palpitation, chest discomfort or lower extremity swelling Gastrointestinal:  Denies nausea, heartburn or change in bowel habits Skin: Denies abnormal skin rashes Lymphatics: Denies new lymphadenopathy or easy bruising Neurological:Denies numbness, tingling or new weaknesses Behavioral/Psych: Mood is stable, no new changes  All other systems were reviewed with the patient and are negative.  PHYSICAL EXAMINATION: ECOG PERFORMANCE STATUS: 1 - Symptomatic but completely ambulatory  Vitals:   04/15/24 1403 04/15/24 1404  BP: (!) 141/95 (!) 128/95  Pulse: (!) 117   Resp: 17   Temp: 97.7 F (36.5 C)   SpO2: 100%    Filed Weights   04/15/24 1403  Weight: 126 lb 6.4 oz (57.3 kg)    GENERAL:alert, no distress and comfortable SKIN: skin color, texture, turgor are normal, no rashes or significant lesions EYES: normal, conjunctiva are pink and non-injected, sclera clear OROPHARYNX:no exudate, no erythema and lips, buccal mucosa, and tongue normal  NECK: supple, thyroid   normal size, non-tender, without nodularity LYMPH:  no palpable lymphadenopathy in the cervical, axillary or inguinal LUNGS: clear to auscultation and percussion with normal breathing effort HEART: regular rate & rhythm and no murmurs and no lower extremity edema ABDOMEN:abdomen soft, non-tender and normal bowel sounds Musculoskeletal:no cyanosis of digits and no clubbing  PSYCH: alert & oriented x 3 with fluent speech NEURO: no focal motor/sensory deficits  Physical Exam GENERAL: Patient appears slightly anxious. ABDOMEN: Abdomen minimally tender.  LABORATORY DATA:  I have reviewed the data as listed    Latest Ref Rng & Units 04/09/2024    4:28 AM 03/28/2024    1:56 PM 12/18/2017    2:58 PM  CBC  WBC 4.0 - 10.5 K/uL 10.1  9.6  5.3   Hemoglobin 12.0 - 15.0 g/dL 87.9  86.6  85.4   Hematocrit 36.0 - 46.0 % 34.1  38.9  42.3   Platelets 150 - 400 K/uL 252  283.0  173.0     @cmpl @  RADIOGRAPHIC STUDIES: I have personally reviewed the radiological images as listed and agreed with the findings in the report. MR ABDOMEN WO CONTRAST Result Date: 03/31/2024 EXAM: MRI Abdomen without Contrast 03/31/2024 08:31:23 AM TECHNIQUE: Multiplanar multisequence MRI of the abdomen was performed without the administration of intravenous contrast. COMPARISON: 03/29/2024 CLINICAL HISTORY: Abnormal gall bladder/liver on CT. FINDINGS: LIVER: The adjacent low attenuation lesion within segment 5 is suboptimally visualized on the current exam which may reflect lack of IV contrast material. GALLBLADDER AND BILIARY SYSTEM: As noted on the recent CT, there is mass-like thickening involving the gallbladder with dilatation of the gallbladder fundus. The mass-like thickening measures 4.7 x 3.8 x 6.6 cm, image 30/9, image 15/4. There is fluid identified surrounding the enlarged gallbladder as well as surrounding soft tissue stranding. This is incompletely characterized without IV contrast material, but in the absence of  clinical signs or symptoms of infection, it is worrisome for gallbladder malignancy. Fusiform dilatation of the proximal common bile duct measures up to 9 mm. There is mild to moderate intrahepatic bile duct dilatation. No obstructing stone identified within the common bile duct. SPLEEN: The spleen is within normal limits in size and appearance. PANCREAS:  No sign of mass within the head of the pancreas. ADRENAL GLANDS: Normal size and morphology bilaterally. No nodule, thickening, or hemorrhage. No periadrenal stranding. KIDNEYS: Unremarkable. LYMPH NODES: No lymphadenopathy. VASCULATURE: Unremarkable. PERITONEUM: No ascites. BOWEL: Grossly unremarkable. ABDOMINAL WALL: No acute abnormality. BONES: No acute abnormality. IMPRESSION: 1. Mass-like thickening of the gallbladder with dilatation of the fundus, measuring 4.7 x 3.8 x 6.6 cm, with surrounding fluid and soft tissue stranding. If there are clinical signs or symptoms of infection, imaging findings may reflect the underlying . severe acute cholecystitis. Alternatively, in the absence of any signs or symptoms of infection, imaging findings are concerning for malignancy. Prompt surgical consultation is advised. 2. The abnormality within segment 4b reported on the CT is not confidently identified on the current exam, which may reflect lack of iv contrast material. 3. Fusiform dilatation of the proximal common bile duct up to 9 mm with mild to moderate intrahepatic ductal dilatation. No obstructing choledocholithiasis identified. 4. The urgent finding will be called to the ordering provider by the Professional Radiology Assistants (PRAs) and documented in the [facility] dashboard. . Electronically signed by: Waddell Calk MD 03/31/2024 09:08 AM EST RP Workstation: [id number]   CT ABDOMEN PELVIS W CONTRAST Result Date: 03/29/2024 EXAM: CT ABDOMEN AND PELVIS WITH CONTRAST 03/29/2024 08:43:00 AM TECHNIQUE: CT of the abdomen and pelvis was performed with the  administration of 100 mL iopamidol  (ISOVUE -300) 61% injection. Multiplanar reformatted images are provided for review. Automated exposure control, iterative reconstruction, and/or weight-based adjustment of the mA/kV was utilized to reduce the radiation dose to as low as reasonably achievable. COMPARISON: None available. CLINICAL HISTORY: RLQ abdominal pain; acute R sided low back pain, RLQ pain. FINDINGS: LOWER CHEST: No acute abnormality. LIVER: A 12 x 11 mm irregular density is seen involving the medial segment of left hepatic lobe adjacent to gallbladder fossa which may represent either small abscess or neoplasm. Further evaluation with MRI is recommended. GALLBLADDER AND BILE DUCTS: Irregular and lobular wall thickening of the gallbladder is noted superiorly. While this may represent severe inflammation, neoplasm or malignancy cannot be excluded. No definite cholelithiasis is noted. No intrahepatic and extrahepatic biliary dilatation is noted. SPLEEN: No acute abnormality. PANCREAS: No acute abnormality. ADRENAL GLANDS: No acute abnormality. KIDNEYS, URETERS AND BLADDER: No stones in the kidneys or ureters. No hydronephrosis. No perinephric or periureteral stranding. Urinary bladder is unremarkable. GI AND BOWEL: Stomach demonstrates no acute abnormality. There is no bowel obstruction. PERITONEUM AND RETROPERITONEUM: No ascites. No free air. VASCULATURE: Aorta is normal in caliber. LYMPH NODES: No lymphadenopathy. REPRODUCTIVE ORGANS: No acute abnormality. BONES AND SOFT TISSUES: No acute osseous abnormality. No focal soft tissue abnormality. IMPRESSION: 1. Irregular and lobular gallbladder wall thickening, possibly severe inflammation; neoplasm cannot be excluded. MRI is recommended. 2. 12 x 11 mm irregular lesion in the medial segment of the left hepatic lobe adjacent to the gallbladder fossa, possibly small abscess or neoplasm. Recommend MRI for further evaluation. 3. Intrahepatic and extrahepatic biliary  dilatation. Correlation with liver function tests as well as MRI is recommended . 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 The malignancy is aggressive, with incomplete resection due to local invasion and residual hepatic lesions. Systemic therapy is indicated to achieve tumor reduction and potential resectability. - Ordered PET scan for further staging to evaluate  for additional metastatic disease. - Ordered molecular tumor sequencing ([facility]) and MMR testing to identify actionable mutations and assess for immunotherapy eligibility. - Ordered CA 19-9 for tumor marker surveillance. - Planned initiation of cisplatin and gemcitabine chemotherapy, with addition of immunotherapy (dupilumab or pembrolizumab/Keytruda) pending molecular and MMR results, following drain removal and port placement. --Chemotherapy consent: Side effects including but does not not limited to, fatigue, nausea, vomiting, diarrhea, hair loss, neuropathy, fluid retention, renal and kidney dysfunction, neutropenic fever, needed for blood transfusion, bleeding, were discussed with patient in great detail. She agrees to proceed. -The goal of chemotherapy is palliative, but the overall treatment plan is curative with additional surgery after chemo. - Discussed radiation therapy as an alternative if surgical resection remains unfeasible post-chemotherapy. - Planned repeat imaging (CT or PET) in 3 months to assess treatment response. - Referred for genetic counseling and testing due to family history of breast cancer. - Referred to chemotherapy education class.  Postoperative care following partial cholecystectomy She is recovering from laparoscopic partial cholecystectomy with surgical drain placement.  Drain output is decreasing, pain is controlled with acetaminophen  and ibuprofen, and she is ambulating independently without signs of infection. Chemotherapy initiation will be deferred until drain removal to minimize infection risk. - Monitor drain output and wound healing. - Follow up with surgeon (Dr. Dasie) for anticipated drain removal next week. - Delay chemotherapy initiation until drain removal. - Encouraged ambulation and avoidance of heavy lifting to prevent postoperative complications, including venous thromboembolism. - Advised leg elevation when seated to reduce VTE risk. - Instructed to monitor for infection (fever, chills) and seek care if symptoms develop.  Cancer-related weight loss and nutritional support She has experienced significant unintentional weight loss due to decreased oral intake preoperatively. Appetite has improved postoperatively, and she is tolerating oral intake. Nutritional optimization is critical prior to chemotherapy to enhance treatment tolerance and outcomes. - Encouraged high-calorie, high-protein diet to promote weight gain and improve strength prior to chemotherapy. - Referred to dietitian for nutritional counseling and support. - Advised to maintain oral intake and monitor for further weight loss or appetite changes. - Emphasized importance of nutritional status for chemotherapy tolerance and recovery.  Plan - I reviewed her images, operation notes, and surgical pathology, discussed the finding with patient and her friend in detail. - I recommend chemotherapy cisplatin, gemcitabine and durvalumab for 3 to 4 months, then reevaluate for surgical resection. - Will request MMR and NGS [facility] on her surgical sample - Labs today including CA 19.9.  If it is normal, will obtain ctDNA test. - Port placement by Dr. Dasie or IR - Plan to start chemotherapy the week after her drain removal.   Orders Placed This Encounter  Procedures   Consent Attestation for  Oncology Treatment    The patient is informed of risks, benefits, side-effects of the prescribed oncology treatment. Potential short term and long term side effects and response rates discussed. After a long discussion, the patient made informed decision to proceed.:   Yes   NM PET Image Initial (PI) Skull Base To Thigh    Standing Status:   Future    Expected Date:   04/22/2024    Expiration Date:   04/15/2025    If indicated for the ordered procedure, I authorize the administration of a radiopharmaceutical per Radiology protocol:   Yes    Is the patient pregnant?:   No    Preferred imaging location?:   [facility]   Cancer antigen 19-9  Standing Status:   Standing    Number of Occurrences:   20    Expiration Date:   04/15/2025   Comprehensive metabolic panel with GFR    Standing Status:   Standing    Number of Occurrences:   50    Expiration Date:   04/15/2025   Pregnancy, urine    Standing Status:   Standing    Number of Occurrences:   20    Next Expected Occurrence:   04/18/2024    Expiration Date:   04/15/2025   CBC with Differential (Cancer Center Only)    Standing Status:   Future    Expected Date:   05/04/2024    Expiration Date:   05/04/2025   CMP (Cancer Center only)    Standing Status:   Future    Expected Date:   05/04/2024    Expiration Date:   05/04/2025   T4    Standing Status:   Future    Expected Date:   05/04/2024    Expiration Date:   05/04/2025   TSH    Standing Status:   Future    Expected Date:   05/04/2024    Expiration Date:   05/04/2025   Magnesium    Standing Status:   Future    Expected Date:   05/04/2024    Expiration Date:   05/04/2025   CBC with Differential (Cancer Center Only)    Standing Status:   Future    Expected Date:   05/11/2024    Expiration Date:   05/11/2025   CMP (Cancer Center only)    Standing Status:   Future    Expected Date:   05/11/2024    Expiration Date:   05/11/2025   Magnesium    Standing Status:   Future    Expected Date:   05/11/2024     Expiration Date:   05/11/2025   CBC with Differential (Cancer Center Only)    Standing Status:   Future    Expected Date:   05/25/2024    Expiration Date:   05/25/2025   CMP (Cancer Center only)    Standing Status:   Future    Expected Date:   05/25/2024    Expiration Date:   05/25/2025   Magnesium    Standing Status:   Future    Expected Date:   05/25/2024    Expiration Date:   05/25/2025   CBC with Differential (Cancer Center Only)    Standing Status:   Future    Expected Date:   06/01/2024    Expiration Date:   06/01/2025   CMP (Cancer Center only)    Standing Status:   Future    Expected Date:   06/01/2024    Expiration Date:   06/01/2025   Magnesium    Standing Status:   Future    Expected Date:   06/01/2024    Expiration Date:   06/01/2025   CBC with Differential (Cancer Center Only)    Standing Status:   Future    Expected Date:   06/15/2024    Expiration Date:   06/15/2025   CMP (Cancer Center only)    Standing Status:   Future    Expected Date:   06/15/2024    Expiration Date:   06/15/2025   T4    Standing Status:   Future    Expected Date:   06/15/2024    Expiration Date:   06/15/2025   TSH    Standing Status:  Future    Expected Date:   06/15/2024    Expiration Date:   06/15/2025   Magnesium    Standing Status:   Future    Expected Date:   06/15/2024    Expiration Date:   06/15/2025   CBC with Differential (Cancer Center Only)    Standing Status:   Future    Expected Date:   06/22/2024    Expiration Date:   06/22/2025   CMP (Cancer Center only)    Standing Status:   Future    Expected Date:   06/22/2024    Expiration Date:   06/22/2025   Magnesium    Standing Status:   Future    Expected Date:   06/22/2024    Expiration Date:   06/22/2025   CBC with Differential (Cancer Center Only)    Standing Status:   Future    Expected Date:   07/06/2024    Expiration Date:   07/06/2025   CMP (Cancer Center only)    Standing Status:   Future    Expected Date:   07/06/2024    Expiration  Date:   07/06/2025   Magnesium    Standing Status:   Future    Expected Date:   07/06/2024    Expiration Date:   07/06/2025   CBC with Differential (Cancer Center Only)    Standing Status:   Future    Expected Date:   07/13/2024    Expiration Date:   07/13/2025   CMP (Cancer Center only)    Standing Status:   Future    Expected Date:   07/13/2024    Expiration Date:   07/13/2025   Magnesium    Standing Status:   Future    Expected Date:   07/13/2024    Expiration Date:   07/13/2025   PHYSICIAN COMMUNICATION ORDER    A baseline Audiogram is recommended prior to initiation of cisplatin chemotherapy.   ONCBCN PHYSICIAN COMMUNICATION 2    Thyroid  function tests at baseline and every 3rd cycle.    All questions were answered. The patient knows to call the clinic with any problems, questions or concerns. I spent 50 minutes counseling the patient face to face. The total time spent in the appointment was 60 minutes including review of chart and various tests results, discussions about plan of care and coordination of care plan.     Onita Mattock, MD 04/15/2024 3:32 PM    "

## 2024-04-16 ENCOUNTER — Other Ambulatory Visit: Payer: Self-pay

## 2024-04-16 LAB — CANCER ANTIGEN 19-9: CA 19-9: 29 U/mL (ref 0–35)

## 2024-04-18 ENCOUNTER — Encounter: Payer: Self-pay | Admitting: Hematology

## 2024-04-18 ENCOUNTER — Other Ambulatory Visit: Payer: Self-pay | Admitting: Surgery

## 2024-04-18 ENCOUNTER — Other Ambulatory Visit: Payer: Self-pay | Admitting: Hematology

## 2024-04-18 DIAGNOSIS — C23 Malignant neoplasm of gallbladder: Secondary | ICD-10-CM

## 2024-04-18 LAB — HM MAMMOGRAPHY

## 2024-04-18 NOTE — Addendum Note (Signed)
 Addended by: DASIE LEONOR CROME on: 04/18/2024 02:57 PM   Modules accepted: Orders

## 2024-04-19 LAB — SURGICAL PATHOLOGY

## 2024-04-19 NOTE — Progress Notes (Signed)
 "  PROVIDER:  LEONOR MACARIO DAWN, MD  MRN: [id number] DOB: Feb 19, 1976 DATE OF ENCOUNTER: 04/19/2024 Interval History:     [name] is a 48 yo female who presents for postop follow up. She presented with acute cholecystitis and concern for a gallbladder malignancy. She was taken to the OR on 12/12 and was found to have purulent cholecystitis, with complete obliteration of the lumen of the body of the gallbladder, and a small liver nodule near the umbilical fissure. The gallbladder was also very adherent to the duodenum and transverse colon, thus only a partial cholecystectomy was performed. 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 Today she feels much better than she did prior to surgery. Her drain output has been decreasing and is clear and nonbilious. She has been able to eat with no abdominal pain.  Surgical Pathology: A. GALLBLADDER, BIOPSY: -Small focus of invasive carcinoma in the background of extensive necrotic material with focal calcifications.  Note: This focus was not present on either of the frozen section sections.  B. GALLBLADDER, CHOLECYSTECTOMY: -  Invasive moderately differentiated adenocarcinoma with mucinous features in the background of high-grade biliary intraepithelial neoplasia involving full-thickness of the gallbladder wall, see note.  Note: The wall of the gallbladder is extensively involved by an invasive moderately differentiated adenocarcinoma with essentially full-thickness involvement.  The tumor is interpreted as a primary gallbladder carcinoma given the presence of high-grade biliary intraepithelial neoplasia present in the background.  The overall orientation is difficult to ascertain; however, tumor is present on cautery.  The overall pathologic stage is interpreted as pT2 and cannot be further differentiated between pT2a  and pT2b due to lack of orientation and definitive evidence of cirrhosis or hepatic parenchyma on either side of the gallbladder.  In addition, clinical/surgical correlation is necessary to ascertain for possible direct extension.  Margins are also difficult to accurately ascertained and a definitive cystic duct is not identified grossly, and there is tumor present on cautery artifact suggestive of a possible positive margin, clinical/surgical correlation necessary.  C. 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  Note: Dr. Rebbecca has peer reviewed the overall case and agrees with the interpretation.  ONCOLOGY TABLE:  GALLBLADDER, CARCINOMA: Resection  Procedure: Cholecystectomy Tumor Site: Extensively involving the gallbladder Tumor Size: Difficult to ascertain total size as involves the vast majority of the gallbladder wall Histologic Type: Adenocarcinoma with mucinous features Histologic Grade: Moderately differentiated Tumor Extension: The gallbladder is difficult to orient; however, the tumor involves the full-thickness of the wall and is present on cautery; however no definitive hepatic parenchyma tissue is present to differentiate pT2a versus pT2b and the overall interpretation is of pT2 Lymphatic and/or Vascular Invasion: Not identified Margins:      Margin Status for Invasive Carcinoma: The gallbladder is disrupted and a definitive cystic duct is not identified there is tumor on cautery, however, suggestive of a positive unoriented margin      Margin Status for Intraepithelial Neoplasia: N/A, refer to above description for invasive carcinoma margin status Regional Lymph Nodes: N/A; no lymph nodes submitted or found      Number of Lymph Nodes with Tumor: N/A      Number of Lymph Nodes Examined: 0 Distant Metastasis:      Distant Site(s) Involved: Presence of liver metastasis (parts C) Pathologic Stage  Classification (pTNM, AJCC 8th Edition): pT2, pNX, pM1  Physical Examination:   Physical Exam Constitutional:      General: She is not in acute distress.    Appearance: Normal appearance.  Pulmonary:     Effort: Pulmonary effort is normal. No respiratory distress.  Abdominal:     General: There is no distension.     Palpations: Abdomen is soft.     Tenderness: There is no abdominal tenderness.     Comments: Incisions are healing well with no erythema, induration, or drainage.  RUQ JP drain is serosanguineous.  Skin:    General: Skin is warm and dry.     Coloration: Skin is not jaundiced.  Neurological:     General: No focal deficit present.     Mental Status: She is alert and oriented to person, place, and time.        Assessment and Plan:     [name] is a 48 y.o. female who underwent laparoscopic partial cholecystectomy, abscess drainage and liver biopsy on 04/08/24 for acute cholecystitis. Her final pathology showed invasive adenocarcinoma, which was also present in a liver nodule. She has seen medical oncology (Dr. Lanny), and is scheduled for a staging PET tomorrow, as well as port placement by IR next week. This is a locally advanced gallbladder cancer, and the liver nodule is an indicator of disseminated disease, although this was the only visible site of disease outside the gallbladder intraoperatively. I discussed the intra-op findings with the patient today and reviewed her pathology. I recommended proceeding with chemotherapy, which she is to begin in the next few weeks. Due to the presence of disease separate from the primary tumor, I do not feel that surgery will be of benefit to her. I discussed referral to an academic center for a second opinion and possible enrollment in a clinical trial if eligible. Her case will also be reviewed at [facility] tomorrow.   I removed her surgical drain today, and from my standpoint she may begin chemotherapy as soon as Dr. Lanny  feels it is appropriate. All questions were answered.  Diagnoses and all orders for this visit:  Adenocarcinoma of gallbladder (CMS/HHS-HCC)  Encounter for follow-up examination      The plan was discussed in detail with the patient today, who expressed understanding.  The patient has my contact information, and understands to call me with any additional questions or concerns in the interval.  I would be happy to see the patient back sooner if the need arises.   SHELBY LYNN ALLEN, MD  "

## 2024-04-20 ENCOUNTER — Encounter (HOSPITAL_COMMUNITY)
Admission: RE | Admit: 2024-04-20 | Discharge: 2024-04-20 | Disposition: A | Discharge status: Home / Self Care | Source: Ambulatory Visit | Attending: Hematology | Admitting: Hematology

## 2024-04-20 ENCOUNTER — Other Ambulatory Visit: Payer: Self-pay

## 2024-04-20 ENCOUNTER — Encounter: Admitting: Internal Medicine

## 2024-04-20 ENCOUNTER — Ambulatory Visit: Payer: Self-pay | Admitting: Hematology

## 2024-04-20 ENCOUNTER — Inpatient Hospital Stay

## 2024-04-20 ENCOUNTER — Other Ambulatory Visit: Payer: Self-pay | Admitting: *Deleted

## 2024-04-20 ENCOUNTER — Encounter: Payer: Self-pay | Admitting: Internal Medicine

## 2024-04-20 DIAGNOSIS — C23 Malignant neoplasm of gallbladder: Secondary | ICD-10-CM

## 2024-04-20 LAB — MISCELLANEOUS TEST

## 2024-04-20 LAB — GLUCOSE, CAPILLARY: Glucose-Capillary: 94 mg/dL (ref 70–99)

## 2024-04-20 MED ORDER — FLUDEOXYGLUCOSE F - 18 (FDG) INJECTION
6.3000 | Freq: Once | INTRAVENOUS | Status: AC
  Administered 2024-04-20: 6.43 via INTRAVENOUS

## 2024-04-20 NOTE — Progress Notes (Signed)
 The proposed treatment discussed in conference is for discussion purpose only and is not a binding recommendation.  The patients have not been physically examined, or presented with their treatment options.  Therefore, final treatment plans cannot be decided.

## 2024-04-20 NOTE — Progress Notes (Signed)
 PATIENT NAVIGATOR PROGRESS NOTE  Name: [name] Date: 04/20/2024 MRN: [id number]  DOB: 11/17/1975   Tempus testing was order per Dr. Demetra request.  Patient was able to come in to cancer center after the PET scan today to have labwork drawn.  Updated patient on what was discussed during our GI MDC Conference this morning.  Patient agreed to proceed with chemo/immuno as scheduled, but asked to be referred to another surgical oncologist for a second opinion. Dr. Lanny was informed of patient's request.  Per Dr. Lanny, referral to be sent to Dr. Romero and Dr. Lavon office at [facility] for second surgical opinion.  Dr. Lanny also recommended referral be sent to [facility] to see if patient qualifies for any clinical trials at their organization.  Referrals were sent to both [facility] (Dr. Barbaraann and Dr. Romero), and also to Dr. Sheilda at [facility]. Called and left VM informing patient referrals were sent to their offices.    Time spent counseling/coordinating care: 45-60 minutes

## 2024-04-25 ENCOUNTER — Other Ambulatory Visit: Payer: Self-pay

## 2024-04-26 ENCOUNTER — Telehealth: Payer: Self-pay

## 2024-04-26 ENCOUNTER — Other Ambulatory Visit: Payer: Self-pay

## 2024-04-26 NOTE — H&P (Signed)
 "   Chief Complaint: Request for port insertion for chemotherapy  Referring Provider(s): Feng,Yan   Supervising Physician: Johann Sieving  Patient Status: [facility] - Out-pt  History of Present Illness: [name] is a 48 y.o. female who underwent laparoscopic partial cholecystectomy, abscess drainage (removed 12/23 by surgery) and liver biopsy on 04/08/24 for acute cholecystitis. Pathology from that procedure revealed invasive adenocarcinoma for which she is follow by Dr. Lanny with oncology. Plan for treatment includes chemotherapy which is to begin sometime in the next couple of weeks. IR was consulted for port placement. Patient presents today to undergo this procedure.   Confirms NPO since MN *** and ride/supervision available for 24 hours.  Does not wear CPAP or use supplemental home O2 ***  Denies fever, chills, SOB, CP, sore throat, N/V, abd pain, blood in stool or urine, abnormal bruising, leg swelling, back pain.   Allergies Reviewed:  Shellfish allergy   Patient is Full Code  Past Medical History:  Diagnosis Date   Allergy     Past Surgical History:  Procedure Laterality Date   CESAREAN SECTION     CHOLECYSTECTOMY N/A 04/08/2024   Procedure: LAPAROSCOPIC CHOLECYSTECTOMY;  Surgeon: Dasie Leonor CROME, MD;  Location: [facility] OR;  Service: General;  Laterality: N/A;   MOLE REMOVAL     OPEN PARTIAL HEPATECTOMY  N/A 04/08/2024   Procedure: HEPATECTOMY, PARTIAL, OPEN;  Surgeon: Dasie Leonor CROME, MD;  Location: [facility] OR;  Service: General;  Laterality: N/A;      Medications: Prior to Admission medications  Medication Sig Start Date End Date Taking? Authorizing Provider  acetaminophen  (TYLENOL ) 500 MG tablet Take 500-1,000 mg by mouth every 6 (six) hours as needed (pain.).    [provider]  folic acid (FOLVITE) 1 MG tablet Take 1 mg by mouth at bedtime. 11/26/17   [provider]  ibuprofen (ADVIL) 200 MG tablet Take 200-400 mg by mouth every 8 (eight)  hours as needed (pain.).    [provider]  JUNEL FE 1/20 1-20 MG-MCG tablet Take 1 tablet by mouth at bedtime. 10/09/11   [provider]  lidocaine -prilocaine  (EMLA ) cream Apply to affected area once 04/15/24   Lanny Callander, MD  Multiple Vitamin (MULTIVITAMIN WITH MINERALS) TABS tablet Take 1 tablet by mouth in the morning.    [provider]  ondansetron  (ZOFRAN ) 8 MG tablet Take 1 tablet (8 mg total) by mouth every 8 (eight) hours as needed for nausea or vomiting. Start on the third day after cisplatin. 04/15/24   Lanny Callander, MD  prochlorperazine  (COMPAZINE ) 10 MG tablet Take 1 tablet (10 mg total) by mouth every 6 (six) hours as needed (Nausea or vomiting). 04/15/24   Lanny Callander, MD     Family History  Problem Relation Age of Onset   Breast cancer Mother 76   Hyperlipidemia Brother        Big Brother   Hyperlipidemia Brother        Little Brother   Hypertension Brother        also blocked artery ? opened up.   Breast cancer Maternal Grandmother     Social History   Socioeconomic History   Marital status: Significant Other    Spouse name: Not on file   Number of children: 1   Years of education: Not on file   Highest education level: Not on file  Occupational History   Not on file  Tobacco Use   Smoking status: Never   Smokeless tobacco: Never  Vaping  Use   Vaping status: Never Used  Substance and Sexual Activity   Alcohol use: Not Currently   Drug use: No   Sexual activity: Not on file  Other Topics Concern   Not on file  Social History Narrative   Works full time as a customer service manager for a therapist, 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   8+ hours of sleep per night   1 daughter (six years of age) hh of 3       Neg td  ocass etoh    Exercise .   Social Drivers of Health   Tobacco Use: Low Risk  (04/19/2024)   Received from [facility]   Patient History    Smoking Tobacco Use: Never    Smokeless Tobacco Use:  Never    Passive Exposure: Not on file  Financial Resource Strain: Not on file  Food Insecurity: No Food Insecurity (04/08/2024)   Epic    Worried About Running Out of Food in the Last Year: Never true    Ran Out of Food in the Last Year: Never true  Transportation Needs: No Transportation Needs (04/08/2024)   Epic    Lack of Transportation (Medical): No    Lack of Transportation (Non-Medical): No  Physical Activity: Not on file  Stress: Not on file  Social Connections: Socially Integrated (04/08/2024)   Social Connection and Isolation Panel    Frequency of Communication with Friends and Family: More than three times a week    Frequency of Social Gatherings with Friends and Family: More than three times a week    Attends Religious Services: More than 4 times per year    Active Member of Clubs or Organizations: Yes    Attends Banker Meetings: More than 4 times per year    Marital Status: Married  Depression (PHQ2-9): Low Risk (04/15/2024)   Depression (PHQ2-9)    PHQ-2 Score: 0  Alcohol Screen: Not on file  Housing: Low Risk (04/08/2024)   Epic    Unable to Pay for Housing in the Last Year: No    Number of Times Moved in the Last Year: 0    Homeless in the Last Year: No  Utilities: Not At Risk (04/08/2024)   Epic    Threatened with loss of utilities: No  Health Literacy: Not on file     Review of Systems: A 12 point ROS discussed and pertinent positives are indicated in the HPI above.  All other systems are negative.  Review of Systems  Vital Signs: LMP 03/15/2024 (Approximate)     Physical Exam  Imaging: NM PET Image Initial (PI) Skull Base To Thigh Result Date: 04/20/2024 EXAM: PET AND CT SKULL BASE TO MID THIGH 04/20/2024 08:59:25 AM TECHNIQUE: RADIOPHARMACEUTICAL: 6.43 mCi F-18 FDG Uptake time 60 minutes. Glucose level 94 mg/dL. PET imaging was acquired from the base of the skull to the mid thighs. Non-contrast enhanced computed tomography was  obtained for attenuation correction and anatomic localization. COMPARISON: Abdominal MRI 03/31/2024. CLINICAL HISTORY: Initial PET for Gallbladder/biliary cancer, staging, Gallbladder cancer Cholecystectomy 12/12. FINDINGS: HEAD AND NECK: No metabolically active cervical lymphadenopathy. CHEST: No metabolically active pulmonary nodules. No metabolically active lymphadenopathy. ABDOMEN AND PELVIS: There is a rim of intensely hypermetabolic tissue which co-registers with the thickened residual gallbladder post partial cholecystectomy. Activity is intense with SUV max equal 9.2. The gallbladder measures 3.8 cm in diameter with the thickened and hypermetabolic tissue along the gallbladder fossa measuring 1.5 cm. Hypermetabolic  activity extends to the gallbladder neck with SUV max equal 8.5 on image 140 adjacent to the porta hepatis. The lesion of concern on comparison MRI within the liver parenchyma adjacent to the porta hepatis corresponds to a hypermetabolic focus with SUV max equal 6.5 on image 135. There is a corresponding subtle low density lesion on the CT portion of the exam measuring 11 mm x 10 mm on image number 4. This lesion is at the edge of the inferior left hepatic lobe and segment 4b close to the hypermetabolic gallbladder tissue at the neck of the gallbladder. There are no additional hypermetabolic foci within the liver. No hypermetabolic periportal lymph nodes. Physiologic activity within the gastrointestinal and genitourinary systems. BONES AND SOFT TISSUE: No metabolically active aggressive osseous lesion. IMPRESSION: 1. Hypermetabolic gallbladder wall thickening post partial cholecystectomy, extending from the body of the gallbladder to the gallbladder neck. 2. Small Focal metabolic activity in the adjacent liver margin most suggestive of local extension into the liver parenchyma by gallbladder carcinoma. 3. No additional hypermetabolic foci within the liver. 4. No hypermetabolic periportal lymph  nodes or other hypermetabolic metastatic disease. Electronically signed by: Norleen Boxer MD 04/20/2024 12:00 PM EST RP Workstation: [id number]   MR ABDOMEN WO CONTRAST Result Date: 03/31/2024 EXAM: MRI Abdomen without Contrast 03/31/2024 08:31:23 AM TECHNIQUE: Multiplanar multisequence MRI of the abdomen was performed without the administration of intravenous contrast. COMPARISON: 03/29/2024 CLINICAL HISTORY: Abnormal gall bladder/liver on CT. FINDINGS: LIVER: The adjacent low attenuation lesion within segment 5 is suboptimally visualized on the current exam which may reflect lack of IV contrast material. GALLBLADDER AND BILIARY SYSTEM: As noted on the recent CT, there is mass-like thickening involving the gallbladder with dilatation of the gallbladder fundus. The mass-like thickening measures 4.7 x 3.8 x 6.6 cm, image 30/9, image 15/4. There is fluid identified surrounding the enlarged gallbladder as well as surrounding soft tissue stranding. This is incompletely characterized without IV contrast material, but in the absence of clinical signs or symptoms of infection, it is worrisome for gallbladder malignancy. Fusiform dilatation of the proximal common bile duct measures up to 9 mm. There is mild to moderate intrahepatic bile duct dilatation. No obstructing stone identified within the common bile duct. SPLEEN: The spleen is within normal limits in size and appearance. PANCREAS: No sign of mass within the head of the pancreas. ADRENAL GLANDS: Normal size and morphology bilaterally. No nodule, thickening, or hemorrhage. No periadrenal stranding. KIDNEYS: Unremarkable. LYMPH NODES: No lymphadenopathy. VASCULATURE: Unremarkable. PERITONEUM: No ascites. BOWEL: Grossly unremarkable. ABDOMINAL WALL: No acute abnormality. BONES: No acute abnormality. IMPRESSION: 1. Mass-like thickening of the gallbladder with dilatation of the fundus, measuring 4.7 x 3.8 x 6.6 cm, with surrounding fluid and soft tissue stranding. If  there are clinical signs or symptoms of infection, imaging findings may reflect the underlying . severe acute cholecystitis. Alternatively, in the absence of any signs or symptoms of infection, imaging findings are concerning for malignancy. Prompt surgical consultation is advised. 2. The abnormality within segment 4b reported on the CT is not confidently identified on the current exam, which may reflect lack of iv contrast material. 3. Fusiform dilatation of the proximal common bile duct up to 9 mm with mild to moderate intrahepatic ductal dilatation. No obstructing choledocholithiasis identified. 4. The urgent finding will be called to the ordering provider by the Professional Radiology Assistants (PRAs) and documented in the [facility] dashboard. . Electronically signed by: Waddell Calk MD 03/31/2024 09:08 AM EST RP Workstation: [id number]   CT ABDOMEN  PELVIS W CONTRAST Result Date: 03/29/2024 EXAM: CT ABDOMEN AND PELVIS WITH CONTRAST 03/29/2024 08:43:00 AM TECHNIQUE: CT of the abdomen and pelvis was performed with the administration of 100 mL iopamidol  (ISOVUE -300) 61% injection. Multiplanar reformatted images are provided for review. Automated exposure control, iterative reconstruction, and/or weight-based adjustment of the mA/kV was utilized to reduce the radiation dose to as low as reasonably achievable. COMPARISON: None available. CLINICAL HISTORY: RLQ abdominal pain; acute R sided low back pain, RLQ pain. FINDINGS: LOWER CHEST: No acute abnormality. LIVER: A 12 x 11 mm irregular density is seen involving the medial segment of left hepatic lobe adjacent to gallbladder fossa which may represent either small abscess or neoplasm. Further evaluation with MRI is recommended. GALLBLADDER AND BILE DUCTS: Irregular and lobular wall thickening of the gallbladder is noted superiorly. While this may represent severe inflammation, neoplasm or malignancy cannot be excluded. No definite cholelithiasis is noted. No  intrahepatic and extrahepatic biliary dilatation is noted. SPLEEN: No acute abnormality. PANCREAS: No acute abnormality. ADRENAL GLANDS: No acute abnormality. KIDNEYS, URETERS AND BLADDER: No stones in the kidneys or ureters. No hydronephrosis. No perinephric or periureteral stranding. Urinary bladder is unremarkable. GI AND BOWEL: Stomach demonstrates no acute abnormality. There is no bowel obstruction. PERITONEUM AND RETROPERITONEUM: No ascites. No free air. VASCULATURE: Aorta is normal in caliber. LYMPH NODES: No lymphadenopathy. REPRODUCTIVE ORGANS: No acute abnormality. BONES AND SOFT TISSUES: No acute osseous abnormality. No focal soft tissue abnormality. IMPRESSION: 1. Irregular and lobular gallbladder wall thickening, possibly severe inflammation; neoplasm cannot be excluded. MRI is recommended. 2. 12 x 11 mm irregular lesion in the medial segment of the left hepatic lobe adjacent to the gallbladder fossa, possibly small abscess or neoplasm. Recommend MRI for further evaluation. 3. Intrahepatic and extrahepatic biliary dilatation. Correlation with liver function tests as well as MRI is recommended . Electronically signed by: Lynwood Seip MD 03/29/2024 09:04 AM EST RP Workstation: [id number]    Labs:  CBC: Recent Labs    03/28/24 1356 04/09/24 0428  WBC 9.6 10.1  HGB 13.3 12.0  HCT 38.9 34.1*  PLT 283.0 252    COAGS: No results for input(s): INR, APTT in the last 8760 hours.  BMP: Recent Labs    03/28/24 1356 04/09/24 0428 04/15/24 1522  NA 135 134* 137  K 4.1 4.0 3.8  CL 99 102 100  CO2 27 20* 23  GLUCOSE 82 143* 97  BUN 16 14 11   CALCIUM 9.3 8.7* 9.5  CREATININE 0.76 0.78 0.82  GFRNONAA  --  >60 >60    LIVER FUNCTION TESTS: Recent Labs    03/28/24 1356 04/15/24 1522  BILITOT 0.4 0.4  AST 18 32  ALT 13 33  ALKPHOS 89 97  PROT 8.3 8.5*  ALBUMIN 4.3 4.3    TUMOR MARKERS: No results for input(s): AFPTM, CEA, CA199, CHROMGRNA in the last 8760  hours.  Assessment and Plan:  Request for  image guided port insertion for chemotherapy for gallbladder CA approved and scheduled for today as an outpatient. No contraindications for procedure identified in ROS, physical exam, or review of pre-sedation considerations. *** Labs reviewed and within acceptable range *** VSS, afebrile Patient not asked to hold any AC/AP for this low bleeding risk procedure Abx not indicated    Risks and benefits of image guided port-a-catheter placement was discussed with the patient including, but not limited to bleeding, infection, pneumothorax, or fibrin sheath development and need for additional procedures.  All of the patient's questions were answered, patient  is agreeable to proceed. Consent signed and in chart.   Thank you for allowing our service to participate in Seleena Reimers 's care.    Electronically Signed: Laymon Coast, NP   04/26/2024, 12:24 PM     I spent a total of  15 Minutes   in face to face in clinical consultation, greater than 50% of which was counseling/coordinating care for image guided port insertion.   (A copy of this note was sent to the referring provider and the time of visit.)  "

## 2024-04-26 NOTE — Telephone Encounter (Signed)
 Copied from CRM #[id number]. Topic: Clinical - Medication Prior Auth >> Apr 26, 2024  4:11 PM Alfonso ORN wrote: Reason for CRM: pt stated ct scan will not be covered by insurance. pt stated [employer] needs information from  regarding need for ct scan from pcp . Please contact pt with an update [phone]  telephone: [phone] fax:[phone]

## 2024-04-27 ENCOUNTER — Inpatient Hospital Stay

## 2024-04-27 ENCOUNTER — Other Ambulatory Visit: Payer: Self-pay

## 2024-04-27 ENCOUNTER — Ambulatory Visit (HOSPITAL_COMMUNITY)
Admission: RE | Admit: 2024-04-27 | Discharge: 2024-04-27 | Disposition: A | Discharge status: Home / Self Care | Source: Ambulatory Visit | Attending: Hematology | Admitting: Hematology

## 2024-04-27 DIAGNOSIS — C23 Malignant neoplasm of gallbladder: Secondary | ICD-10-CM

## 2024-04-27 HISTORY — PX: IR IMAGING GUIDED PORT INSERTION: IMG5740

## 2024-04-27 LAB — PREGNANCY, URINE: Preg Test, Ur: NEGATIVE

## 2024-04-27 MED ORDER — MIDAZOLAM HCL 2 MG/2ML IJ SOLN
INTRAMUSCULAR | Status: AC
  Filled 2024-04-27: qty 2

## 2024-04-27 MED ORDER — LIDOCAINE HCL 1 % IJ SOLN
20.0000 mL | Freq: Once | INTRAMUSCULAR | Status: AC
  Administered 2024-04-27: 20 mL via INTRADERMAL

## 2024-04-27 MED ORDER — SODIUM CHLORIDE 0.9 % IV SOLN: INTRAVENOUS | Status: DC

## 2024-04-27 MED ORDER — MIDAZOLAM HCL 2 MG/2ML IJ SOLN
INTRAMUSCULAR | Status: AC
  Filled 2024-04-27: qty 4

## 2024-04-27 MED ORDER — FENTANYL CITRATE (PF) 100 MCG/2ML IJ SOLN
INTRAMUSCULAR | Status: AC
  Filled 2024-04-27: qty 2

## 2024-04-27 MED ORDER — MIDAZOLAM HCL (PF) 2 MG/2ML IJ SOLN
INTRAMUSCULAR | Status: AC | PRN
  Administered 2024-04-27: 1 mg via INTRAVENOUS
  Administered 2024-04-27: .5 mg via INTRAVENOUS
  Administered 2024-04-27: 1 mg via INTRAVENOUS
  Administered 2024-04-27: .5 mg via INTRAVENOUS

## 2024-04-27 MED ORDER — FENTANYL CITRATE (PF) 100 MCG/2ML IJ SOLN
INTRAMUSCULAR | Status: AC
  Filled 2024-04-27: qty 4

## 2024-04-27 MED ORDER — HEPARIN SOD (PORK) LOCK FLUSH 100 UNIT/ML IV SOLN
INTRAVENOUS | Status: AC
  Filled 2024-04-27: qty 5

## 2024-04-27 MED ORDER — HEPARIN SOD (PORK) LOCK FLUSH 100 UNIT/ML IV SOLN
INTRAVENOUS | Status: AC | PRN
  Administered 2024-04-27: 500 [IU]

## 2024-04-27 MED ORDER — LIDOCAINE-EPINEPHRINE 1 %-1:100000 IJ SOLN
INTRAMUSCULAR | Status: AC
  Filled 2024-04-27: qty 1

## 2024-04-27 MED ORDER — FENTANYL CITRATE (PF) 100 MCG/2ML IJ SOLN
INTRAMUSCULAR | Status: AC | PRN
  Administered 2024-04-27 (×2): 25 ug via INTRAVENOUS
  Administered 2024-04-27 (×2): 50 ug via INTRAVENOUS

## 2024-04-27 NOTE — Progress Notes (Signed)
 Patient and patient sister-in-law given discharge instructions, education provided no further questions at this time.Able to tolerate PO intake. Patient site is clean, dry, intact upon discharge.

## 2024-04-27 NOTE — Telephone Encounter (Signed)
 Contacted insurance and spoke to [employer]. She inform the CT scan from Dr. Mercer on 03/28/2024 was approved by her insurance. Unless there is copay. She states she is not able to tell the copay. Provided [phone].   Follow up with pt and inform her the information above. Pt verbalized understanding. No further action is needed at this time.

## 2024-04-27 NOTE — Procedures (Signed)
" °  Procedure:  R internal jugular port placement tip SVC/ RA jct Preprocedure diagnosis: The encounter diagnosis was Gallbladder cancer (HCC). Postprocedure diagnosis: same EBL:    minimal Complications:   none immediate  See full dictation in [employer].  CHARM Toribio Faes MD Main # [phone] Pager  [phone] Mobile [phone]    "

## 2024-04-27 NOTE — Progress Notes (Signed)
 Pharmacist Chemotherapy Monitoring - Initial Assessment    Anticipated start date: 05/04/24   The following has been reviewed per standard work regarding the patient's treatment regimen: The patient's diagnosis, treatment plan and drug doses, and organ/hematologic function Lab orders and baseline tests specific to treatment regimen  The treatment plan start date, drug sequencing, and pre-medications Prior authorization status  Patient's documented medication list, including drug-drug interaction screen and prescriptions for anti-emetics and supportive care specific to the treatment regimen The drug concentrations, fluid compatibility, administration routes, and timing of the medications to be used The patient's access for treatment and lifetime cumulative dose history, if applicable  The patient's medication allergies and previous infusion related reactions, if applicable   Changes made to treatment plan:  N/A  Follow up needed:  Port placement scheduled for today (12/31)  [name], RPH, 04/27/2024  12:41 PM

## 2024-05-02 NOTE — Progress Notes (Unsigned)
 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 The malignancy is aggressive, with incomplete resection due to local invasion and residual hepatic lesions. Systemic therapy is indicated to achieve tumor reduction and potential resectability. - Ordered PET scan for further staging to evaluate for additional metastatic disease. - Ordered molecular tumor sequencing ([facility]) and MMR testing to identify actionable mutations and assess for immunotherapy eligibility. - Ordered CA 19-9 for tumor marker surveillance. - Planned initiation of cisplatin  and gemcitabine  chemotherapy, with addition of immunotherapy (dupilumab or pembrolizumab/Keytruda) pending molecular and MMR results, following drain removal and port placement. --Chemotherapy consent: Side effects including but does not not limited to, fatigue, nausea, vomiting, diarrhea, hair loss, neuropathy, fluid retention, renal and kidney dysfunction, neutropenic fever, needed for blood transfusion, bleeding, were discussed with patient in great detail. She agrees to proceed. -The goal of chemotherapy is palliative, but the overall treatment plan is curative with additional surgery after chemo. - Discussed radiation therapy as an alternative if surgical resection remains unfeasible post-chemotherapy. - Planned repeat imaging (CT or PET) in 3 months to assess treatment response. - Referred for genetic counseling and testing due to family history of breast cancer. - Referred to chemotherapy education class which has been completed. -Port-A-Cath placement on 04/29/2024. - 05/04/2024  -patient will have cycle 1 day 1 chemotherapy gemcitabine  and Abraxane along with immunotherapy durvalumab .      Postoperative care following partial cholecystectomy She is recovering from laparoscopic partial cholecystectomy with surgical drain placement.  She reports follow-up with surgeon earlier this week and drain was removed.  She does have surgical abdominal tenderness.  She reports increased gas.  Has had a few large bowel movements, noticing some blood on the toilet paper.  She is using Tylenol  and ibuprofen to help control pain.  States that pain last night was so severe she had a difficult time sleeping.  She does have pain medication which was prescribed postoperative.  Was afraid to take it as she is a few weeks postoperative.  We discussed healing process, and that it may take several more weeks for her to really be pain-free.  If pain is severe, she should take prescribed pain medication, especially if not controlled with acetaminophen  and ibuprofen.  Reviewed instructions to not drive or work if taking narcotic pain medication.  She voiced understanding.  Also recommended use of Gas-X to help with increased gas and bloating.  Would also help with burning in flatulence.  She may continue to use acetaminophen  and ibuprofen for pain which is less severe.   -Tomorrow, 05/04/2024, she will start first-line chemotherapy with gemcitabine  and Abraxane and immunotherapy durvalumab . -Port-A-Cath placement was last week.  Tolerated well. -Chemotherapy education has been completed. -Continue to encourage ambulation and avoidance of heavy lifting to prevent postoperative complications, including venous thromboembolism.  Advised her that moving around would also improve gas and postoperative pain.  She voiced understanding.  States - Advised leg elevation when seated to reduce VTE risk. - Instructed to monitor for infection (fever, chills) and seek care if symptoms develop. - Answered all questions  regarding chemotherapy and expectations for side effects and how to manage them at home.  Reviewed when to call clinic for side effects that are uncontrolled by prescribed medications. -She will stop by [facility] office to provide forms.  She also asks for a note requesting reasonable accommodations while she is undergoing chemotherapy.  This will include working remotely when possible and discouraging travel for work during chemotherapy treatment.  Plan Labs reviewed. -Unremarkable CBC - CMP unremarkable other than blood sugar of 132.  She is not fasting. Answered questions related to chemotherapy and treatment at home for side effects and reviewed appropriate times to call clinic for side effects that are not controlled with recommended home treatment.  She had no further questions. Labs and patient presentation are appropriate for treatment tomorrow, 05/04/2024.  May proceed with cycle 1 day 1 chemotherapy with gemcitabine  and Abraxane and immunotherapy durvalumab  as scheduled. Labs/flush, follow-up, and cycle 1 day 8 chemotherapy with gemcitabine  and Abraxane as currently scheduled.    The patient understands the plans discussed today and is in agreement with them.  She knows to contact our office if she develops concerns prior to her next appointment.  I provided 25 minutes of face-to-face time during this encounter and > 50% was spent counseling as documented under my assessment and plan.    Powell FORBES Lessen, NP  [facility] - A DEPT OF [facility] [address] [place] [address] Dept: [phone] Dept Fax: [phone]   No orders of the defined types were placed in this encounter.     CHIEF COMPLAINT:  CC: Gallbladder cancer  Current Treatment: Cisplatin , gemcitabine , and durvalumab   INTERVAL HISTORY:  [name] is here today for repeat clinical assessment.  Her initial visit with Dr. Lanny was 04/15/2024.    she will begin chemotherapy with cisplatin , gemcitabine , and durvalumab  tomorrow, 05/04/2024.  She completed chemotherapy education on 04/27/2024.  She did have PET scan on 04/20/2024.  This confirmed hypermetabolic gallbladder wall thickening extending from the body of the gallbladder to the gallbladder neck.  There is a small focal metabolic activity in the adjacent liver margin which is suggestive of local extension into the liver parenchyma by gallbladder carcinoma.  There is no additional hypermetabolic foci within the liver.  There is no hypermetabolic periportal lymph nodes or other hypermetabolic metastatic disease.  NGS [facility] sent on 04/20/2024.  Results are pending.  She had surgical drain removed earlier this week.  Have residual abdominal tenderness.  She has gas and bloating as well.  Reluctant to take postoperative pain medications prescribed by surgeon.  Advised her when this was appropriate.  Precautions against driving or working after taking narcotic pain medications were reviewed.  Also recommended Gas-X to help treat gas and bloating she may continue to use ibuprofen and acetaminophen  to control the severe pain.  She reports needing FMLA forms and note requesting reasonable accommodations so that she may work remotely and asks to be From travel for work during chemotherapy treatment.  She will stop by [facility] office to discuss forms and necessary note.  She has noted intermittent blood after bowel movements.  Monitor bowel movements produce more blood.  This is gradually improving.  Bowel movements have not quite returned to normal since surgery.  She is trying to eat, though trying to figure out which foods are tolerated by her stomach and which foods she should avoid.  Otherwise she continues to heal well.  She denies chest pain, chest pressure, or shortness of breath. She denies headaches or visual  disturbances. She denies fevers or chills.. Her appetite is good. Her weight has been stable.  Heart  rate and blood pressure are both elevated during today's visit.  This is likely from combination of anxiety and pain as she does admit to having some moderate abdominal tenderness for which she has not taken any thing to manage her pain.  We discussed proper pain management.  Vital signs will be checked prior to tomorrow's chemotherapy administration.  With pain control, blood pressure and heart rate should both be appropriate for treatment.  I have reviewed the past medical history, past surgical history, social history and family history with the patient and they are unchanged from previous note.  ALLERGIES:  is allergic to shellfish allergy.  MEDICATIONS:  Current Outpatient Medications  Medication Sig Dispense Refill   acetaminophen  (TYLENOL ) 500 MG tablet Take 500-1,000 mg by mouth every 6 (six) hours as needed (pain.).     folic acid (FOLVITE) 1 MG tablet Take 1 mg by mouth at bedtime.  3   ibuprofen (ADVIL) 200 MG tablet Take 200-400 mg by mouth every 8 (eight) hours as needed (pain.).     JUNEL FE 1/20 1-20 MG-MCG tablet Take 1 tablet by mouth at bedtime.     lidocaine -prilocaine  (EMLA ) cream Apply to affected area once 30 g 3   Multiple Vitamin (MULTIVITAMIN WITH MINERALS) TABS tablet Take 1 tablet by mouth in the morning.     ondansetron  (ZOFRAN ) 8 MG tablet Take 1 tablet (8 mg total) by mouth every 8 (eight) hours as needed for nausea or vomiting. Start on the third day after cisplatin . 30 tablet 1   prochlorperazine  (COMPAZINE ) 10 MG tablet Take 1 tablet (10 mg total) by mouth every 6 (six) hours as needed (Nausea or vomiting). 30 tablet 1   No current facility-administered medications for this visit.    HISTORY OF PRESENT ILLNESS:   Oncology History  Gallbladder cancer (HCC)  04/08/2024 Cancer Staging   Staging form: Gallbladder, AJCC 8th Edition - Pathologic stage from 04/08/2024: Stage IVB (pT2, pN0, pM1) - Signed by Lanny Callander, MD on 04/15/2024 Total positive nodes:  0 Histologic grade (G): G2 Histologic grading system: 3 grade system Residual tumor (R): RX   04/15/2024 Initial Diagnosis   Gallbladder cancer (HCC)   04/20/2024 PET scan   IMPRESSION: 1. Hypermetabolic gallbladder wall thickening post partial cholecystectomy, extending from the body of the gallbladder to the gallbladder neck. 2. Small Focal metabolic activity in the adjacent liver margin most suggestive of local extension into the liver parenchyma by gallbladder carcinoma. 3. No additional hypermetabolic foci within the liver. 4. No hypermetabolic periportal lymph nodes or other hypermetabolic metastatic disease.     05/04/2024 -  Chemotherapy   Patient is on Treatment Plan : BILIARY TRACT Cisplatin  + Gemcitabine  D1,8 + Durvalumab  (1500) D1 q21d / Durvalumab  (1500) q28d         REVIEW OF SYSTEMS:   Constitutional: Denies fevers, chills or abnormal weight loss Eyes: Denies blurriness of vision Ears, nose, mouth, throat, and face: Denies mucositis or sore throat Respiratory: Denies cough, dyspnea or wheezes Cardiovascular: Denies palpitation, chest discomfort or lower extremity swelling Gastrointestinal: Intermittent abdominal tenderness, sometimes much more severe than others.  Also continues to have increased gas and bloating since surgery.  Has had a few bowel movements which have produced presence of blood in the toilet and sometimes on toilet tissue.  This is gradually improving. Skin: Denies abnormal skin rashes Lymphatics: Denies new lymphadenopathy or easy bruising  Neurological:Denies numbness, tingling or new weaknesses Behavioral/Psych: Mood is stable, no new changes  All other systems were reviewed with the patient and are negative.   VITALS:   Today's Vitals   05/03/24 1345 05/03/24 1346  BP: (!) 134/101 (!) 136/105  Pulse: (!) 117   Resp: 17   Temp: 99.1 F (37.3 C)   SpO2: 100%   Weight: 126 lb 4.8 oz (57.3 kg)   PainSc:  5    Body mass index is 18.65  kg/m.   Wt Readings from Last 3 Encounters:  05/03/24 126 lb 4.8 oz (57.3 kg)  04/27/24 127 lb (57.6 kg)  04/15/24 126 lb 6.4 oz (57.3 kg)    Body mass index is 18.65 kg/m.  Performance status (ECOG): 2 - Symptomatic, <50% confined to bed  PHYSICAL EXAM:   GENERAL:alert, no distress and comfortable SKIN: skin color, texture, turgor are normal, no rashes or significant lesions EYES: normal, Conjunctiva are pink and non-injected, sclera clear OROPHARYNX:no exudate, no erythema and lips, buccal mucosa, and tongue normal  NECK: supple, thyroid  normal size, non-tender, without nodularity LYMPH:  no palpable lymphadenopathy in the cervical, axillary or inguinal LUNGS: clear to auscultation and percussion with normal breathing effort HEART: regular rate & rhythm and no murmurs and no lower extremity edema.  Blood pressure and heart rate both elevated during today's visit. ABDOMEN:abdomen soft with moderate tenderness, especially over surgical scars.  Hyperactive bowel sounds noted. Musculoskeletal:no cyanosis of digits and no clubbing  NEURO: alert & oriented x 3 with fluent speech, no focal motor/sensory deficits  LABORATORY DATA:  I have reviewed the data as listed    Component Value Date/Time   NA 135 05/03/2024 1306   K 3.7 05/03/2024 1306   CL 98 05/03/2024 1306   CO2 27 05/03/2024 1306   GLUCOSE 132 (H) 05/03/2024 1306   BUN 12 05/03/2024 1306   CREATININE 0.66 05/03/2024 1306   CALCIUM 9.4 05/03/2024 1306   PROT 8.1 05/03/2024 1306   ALBUMIN 4.4 05/03/2024 1306   AST 26 05/03/2024 1306   ALT 23 05/03/2024 1306   ALKPHOS 106 05/03/2024 1306   BILITOT 0.6 05/03/2024 1306   GFRNONAA >60 05/03/2024 1306   GFRAA  09/23/2008 1123    >60        The eGFR has been calculated using the MDRD equation. This calculation has not been validated in all clinical situations. eGFR's persistently <60 mL/min signify possible Chronic Kidney Disease.    Lab Results  Component  Value Date   WBC 8.0 05/03/2024   NEUTROABS 5.5 05/03/2024   HGB 12.7 05/03/2024   HCT 37.4 05/03/2024   MCV 88.0 05/03/2024   PLT 271 05/03/2024    RADIOGRAPHIC STUDIES: IR IMAGING GUIDED PORT INSERTION Result Date: 04/29/2024 EXAM: Implanted venous access port placement COMPARISON: None. CLINICAL HISTORY: for chemotherapy Additional clinical history: Gallbladder carcinoma. Complications: No immediate complications. Conscious Sedation: 3 mg Versed  IV, 150 mcg fentanyl  IV. Sedation Technique: Under physician supervision, Versed  and fentanyl  were administered IV for moderate sedation. Pulse oximetry, heart rate, and blood pressure were continuously monitored with an independent trained observer present. Physician face-to-face sedation time: 21 minutes. Discussion: Time out performed including verification of patient, date of birth, procedure, and access site as appropriate. Informed written consent was obtained. The access site and chest wall were prepared and draped using maximal sterile barrier technique including cutaneous antisepsis. The patient was positioned supine. Initial imaging was performed. Venous access: Access site: Right internal jugular Laterality: Right Local  anesthesia was administered. Under ultrasound guidance, venous access was obtained. A guidewire was advanced and a subcutaneous pocket was created. An implanted venous access port was placed with the catheter tunneled to the venous entry site. Port details: Port type: Single lumen Catheter tip position confirmed with imaging, in the region of the cavoatrial junction. The port was accessed and aspirated easily and flushed with saline and heparinized saline. The pocket and access site were closed and a sterile dressing was applied. Post-procedure imaging: Immediate post-placement imaging: Fluoroscopy Post-procedure imaging findings: Stable port. No pneumothorax. Contrast: None. Estimated blood loss: Less than 10 mL. Radiation exposure  index (as provided by the fluoroscopic device): 0.4 mGy air kerma. IMPRESSION: 1. Technically successful placement of a right IJ implanted venous access port. 2. No immediate complications. Electronically signed by: Katheleen Faes MD 04/29/2024 10:46 AM EST RP Workstation: [id number]   NM PET Image Initial (PI) Skull Base To Thigh Result Date: 04/20/2024 EXAM: PET AND CT SKULL BASE TO MID THIGH 04/20/2024 08:59:25 AM TECHNIQUE: RADIOPHARMACEUTICAL: 6.43 mCi F-18 FDG Uptake time 60 minutes. Glucose level 94 mg/dL. PET imaging was acquired from the base of the skull to the mid thighs. Non-contrast enhanced computed tomography was obtained for attenuation correction and anatomic localization. COMPARISON: Abdominal MRI 03/31/2024. CLINICAL HISTORY: Initial PET for Gallbladder/biliary cancer, staging, Gallbladder cancer Cholecystectomy 12/12. FINDINGS: HEAD AND NECK: No metabolically active cervical lymphadenopathy. CHEST: No metabolically active pulmonary nodules. No metabolically active lymphadenopathy. ABDOMEN AND PELVIS: There is a rim of intensely hypermetabolic tissue which co-registers with the thickened residual gallbladder post partial cholecystectomy. Activity is intense with SUV max equal 9.2. The gallbladder measures 3.8 cm in diameter with the thickened and hypermetabolic tissue along the gallbladder fossa measuring 1.5 cm. Hypermetabolic activity extends to the gallbladder neck with SUV max equal 8.5 on image 140 adjacent to the porta hepatis. The lesion of concern on comparison MRI within the liver parenchyma adjacent to the porta hepatis corresponds to a hypermetabolic focus with SUV max equal 6.5 on image 135. There is a corresponding subtle low density lesion on the CT portion of the exam measuring 11 mm x 10 mm on image number 4. This lesion is at the edge of the inferior left hepatic lobe and segment 4b close to the hypermetabolic gallbladder tissue at the neck of the gallbladder. There are no  additional hypermetabolic foci within the liver. No hypermetabolic periportal lymph nodes. Physiologic activity within the gastrointestinal and genitourinary systems. BONES AND SOFT TISSUE: No metabolically active aggressive osseous lesion. IMPRESSION: 1. Hypermetabolic gallbladder wall thickening post partial cholecystectomy, extending from the body of the gallbladder to the gallbladder neck. 2. Small Focal metabolic activity in the adjacent liver margin most suggestive of local extension into the liver parenchyma by gallbladder carcinoma. 3. No additional hypermetabolic foci within the liver. 4. No hypermetabolic periportal lymph nodes or other hypermetabolic metastatic disease. Electronically signed by: Norleen Boxer MD 04/20/2024 12:00 PM EST RP Workstation: [id number]   "

## 2024-05-02 NOTE — Assessment & Plan Note (Signed)
 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 The malignancy is aggressive, with incomplete resection due to local invasion and residual hepatic lesions. Systemic therapy is indicated to achieve tumor reduction and potential resectability. - Ordered PET scan for further staging to evaluate for additional metastatic disease. - Ordered molecular tumor sequencing ([facility]) and MMR testing to identify actionable mutations and assess for immunotherapy eligibility. - Ordered CA 19-9 for tumor marker surveillance. - Planned initiation of cisplatin  and gemcitabine  chemotherapy, with addition of immunotherapy (dupilumab or pembrolizumab/Keytruda) pending molecular and MMR results, following drain removal and port placement. --Chemotherapy consent: Side effects including but does not not limited to, fatigue, nausea, vomiting, diarrhea, hair loss, neuropathy, fluid retention, renal and kidney dysfunction, neutropenic fever, needed for blood transfusion, bleeding, were discussed with patient in great detail. She agrees to proceed. -The goal of chemotherapy is palliative, but the overall treatment plan is curative with additional surgery after chemo. - Discussed radiation therapy as an alternative if surgical resection remains unfeasible post-chemotherapy. - Planned repeat imaging (CT or PET) in 3 months to assess treatment response. - Referred for genetic counseling and testing due to family history of breast cancer. - Referred to chemotherapy education class.   Postoperative care following partial cholecystectomy She is recovering from laparoscopic partial cholecystectomy with surgical drain placement. Drain output is decreasing, pain is controlled with acetaminophen  and ibuprofen, and she is ambulating independently without signs of infection. Chemotherapy initiation will be deferred until drain  removal to minimize infection risk. - Monitor drain output and wound healing. - Follow up with surgeon (Dr. Dasie) for anticipated drain removal next week. - Delay chemotherapy initiation until drain removal. - Encouraged ambulation and avoidance of heavy lifting to prevent postoperative complications, including venous thromboembolism. - Advised leg elevation when seated to reduce VTE risk. - Instructed to monitor for infection (fever, chills) and seek care if symptoms develop.

## 2024-05-02 NOTE — Progress Notes (Signed)
 PATIENT NAVIGATOR PROGRESS NOTE  Name: [name] Date: 05/02/2024 MRN: [id number]  DOB: Jan 14, 1976   Patient called in and requested a referral be sent into Dr. Abran Kallman at [facility] for a second surgical opinion.  Patient stated she is scheduled with Dr. Barbaraann at [facility] for second surgical opinion on 05/09/24 but would like to be seen by Dr. Kallman.  She has not yet made a final decision on if she would like to keep her appt with Dr. Lavon office if she is able to be seen by Dr. Kallman. Informed patient I would send referral to Dr. Rumaldo office and they would be reaching out to her to schedule an appt with Dr. Kallman.  Instructed patient to call Dr. Lavon office if she decides to cancel her appt with him.  Patient verbalized understanding and agreement with plan.   Referral faxed to sent to Dr. Rumaldo office.     Time spent counseling/coordinating care: 30-45 minutes

## 2024-05-03 ENCOUNTER — Inpatient Hospital Stay: Attending: Hematology

## 2024-05-03 ENCOUNTER — Encounter: Payer: Self-pay | Admitting: Nurse Practitioner

## 2024-05-03 ENCOUNTER — Inpatient Hospital Stay: Attending: Hematology | Admitting: Nurse Practitioner

## 2024-05-03 VITALS — BP 136/105 | HR 117 | Temp 99.1°F | Resp 17 | Wt 126.3 lb

## 2024-05-03 DIAGNOSIS — C23 Malignant neoplasm of gallbladder: Secondary | ICD-10-CM

## 2024-05-03 DIAGNOSIS — T451X5A Adverse effect of antineoplastic and immunosuppressive drugs, initial encounter: Secondary | ICD-10-CM | POA: Diagnosis not present

## 2024-05-03 DIAGNOSIS — K5900 Constipation, unspecified: Secondary | ICD-10-CM | POA: Diagnosis not present

## 2024-05-03 DIAGNOSIS — Z5111 Encounter for antineoplastic chemotherapy: Secondary | ICD-10-CM | POA: Diagnosis present

## 2024-05-03 DIAGNOSIS — Z803 Family history of malignant neoplasm of breast: Secondary | ICD-10-CM | POA: Diagnosis not present

## 2024-05-03 DIAGNOSIS — F419 Anxiety disorder, unspecified: Secondary | ICD-10-CM | POA: Insufficient documentation

## 2024-05-03 DIAGNOSIS — C787 Secondary malignant neoplasm of liver and intrahepatic bile duct: Secondary | ICD-10-CM | POA: Diagnosis not present

## 2024-05-03 DIAGNOSIS — Z5112 Encounter for antineoplastic immunotherapy: Secondary | ICD-10-CM | POA: Diagnosis present

## 2024-05-03 LAB — CMP (CANCER CENTER ONLY)
ALT: 23 U/L (ref 0–44)
AST: 26 U/L (ref 15–41)
Albumin: 4.4 g/dL (ref 3.5–5.0)
Alkaline Phosphatase: 106 U/L (ref 38–126)
Anion gap: 10 (ref 5–15)
BUN: 12 mg/dL (ref 6–20)
CO2: 27 mmol/L (ref 22–32)
Calcium: 9.4 mg/dL (ref 8.9–10.3)
Chloride: 98 mmol/L (ref 98–111)
Creatinine: 0.66 mg/dL (ref 0.44–1.00)
GFR, Estimated: >60 mL/min
Glucose, Bld: 132 mg/dL — ABNORMAL HIGH (ref 70–99)
Potassium: 3.7 mmol/L (ref 3.5–5.1)
Sodium: 135 mmol/L (ref 135–145)
Total Bilirubin: 0.6 mg/dL (ref 0.0–1.2)
Total Protein: 8.1 g/dL (ref 6.5–8.1)

## 2024-05-03 LAB — CBC WITH DIFFERENTIAL (CANCER CENTER ONLY)
Abs Immature Granulocytes: 0.03 K/uL (ref 0.00–0.07)
Basophils Absolute: 0 K/uL (ref 0.0–0.1)
Basophils Relative: 1 %
Eosinophils Absolute: 0.1 K/uL (ref 0.0–0.5)
Eosinophils Relative: 1 %
HCT: 37.4 % (ref 36.0–46.0)
Hemoglobin: 12.7 g/dL (ref 12.0–15.0)
Immature Granulocytes: 0 %
Lymphocytes Relative: 22 %
Lymphs Abs: 1.8 K/uL (ref 0.7–4.0)
MCH: 29.9 pg (ref 26.0–34.0)
MCHC: 34 g/dL (ref 30.0–36.0)
MCV: 88 fL (ref 80.0–100.0)
Monocytes Absolute: 0.5 K/uL (ref 0.1–1.0)
Monocytes Relative: 6 %
Neutro Abs: 5.5 K/uL (ref 1.7–7.7)
Neutrophils Relative %: 70 %
Platelet Count: 271 K/uL (ref 150–400)
RBC: 4.25 MIL/uL (ref 3.87–5.11)
RDW: 13 % (ref 11.5–15.5)
WBC Count: 8 K/uL (ref 4.0–10.5)
nRBC: 0 % (ref 0.0–0.2)

## 2024-05-03 LAB — TSH: TSH: 2 u[IU]/mL (ref 0.350–4.500)

## 2024-05-03 LAB — PREGNANCY, URINE: Preg Test, Ur: NEGATIVE

## 2024-05-03 LAB — MAGNESIUM: Magnesium: 2 mg/dL (ref 1.7–2.4)

## 2024-05-03 MED FILL — Fosaprepitant Dimeglumine For IV Infusion 150 MG (Base Eq): INTRAVENOUS | Qty: 5 | Status: AC

## 2024-05-03 NOTE — Progress Notes (Signed)
 PATIENT NAVIGATOR PROGRESS NOTE  Name: [name] Date: 05/03/2024 MRN: [id number]  DOB: [birth year]   Reason for visit:  Med/Onc follow-up, Initial chemo on 05/04/24  Comments:   Met with patient during her follow-up appt with Powell Lessen, NP.  Patient is scheduled to start chemo treatments on 05/04/24.   All questions were answered during visit.    Time spent counseling/coordinating care: 45-60 minutes

## 2024-05-04 ENCOUNTER — Other Ambulatory Visit: Payer: Self-pay

## 2024-05-04 ENCOUNTER — Inpatient Hospital Stay: Admitting: Dietician

## 2024-05-04 ENCOUNTER — Inpatient Hospital Stay

## 2024-05-04 ENCOUNTER — Encounter: Payer: Self-pay | Admitting: Nurse Practitioner

## 2024-05-04 ENCOUNTER — Encounter: Payer: Self-pay | Admitting: General Practice

## 2024-05-04 ENCOUNTER — Encounter: Payer: Self-pay | Admitting: Hematology

## 2024-05-04 VITALS — BP 117/84 | HR 97 | Temp 98.3°F | Resp 14 | Ht 69.0 in | Wt 126.4 lb

## 2024-05-04 DIAGNOSIS — C23 Malignant neoplasm of gallbladder: Secondary | ICD-10-CM

## 2024-05-04 DIAGNOSIS — Z5112 Encounter for antineoplastic immunotherapy: Secondary | ICD-10-CM | POA: Diagnosis not present

## 2024-05-04 LAB — T4: T4, Total: 9.9 ug/dL (ref 4.5–12.0)

## 2024-05-04 LAB — CANCER ANTIGEN 19-9: CA 19-9: 25 U/mL (ref 0–35)

## 2024-05-04 MED ORDER — SODIUM CHLORIDE 0.9 % IV SOLN
25.0000 mg/m2 | Freq: Once | INTRAVENOUS | Status: AC
  Administered 2024-05-04: 42 mg via INTRAVENOUS
  Filled 2024-05-04: qty 42

## 2024-05-04 MED ORDER — SODIUM CHLORIDE 0.9 % IV SOLN
1000.0000 mg/m2 | Freq: Once | INTRAVENOUS | Status: AC
  Administered 2024-05-04: 1672 mg via INTRAVENOUS
  Filled 2024-05-04: qty 43.97

## 2024-05-04 MED ORDER — MAGNESIUM SULFATE 2 GM/50ML IV SOLN
2.0000 g | Freq: Once | INTRAVENOUS | Status: AC
  Administered 2024-05-04: 2 g via INTRAVENOUS
  Filled 2024-05-04: qty 50

## 2024-05-04 MED ORDER — DEXAMETHASONE SOD PHOSPHATE PF 10 MG/ML IJ SOLN
10.0000 mg | Freq: Once | INTRAMUSCULAR | Status: AC
  Administered 2024-05-04: 10 mg via INTRAVENOUS
  Filled 2024-05-04: qty 1

## 2024-05-04 MED ORDER — POTASSIUM CHLORIDE IN NACL 20-0.9 MEQ/L-% IV SOLN
Freq: Once | INTRAVENOUS | Status: AC
  Filled 2024-05-04: qty 1000

## 2024-05-04 MED ORDER — SODIUM CHLORIDE 0.9 % IV SOLN: INTRAVENOUS | Status: DC

## 2024-05-04 MED ORDER — PALONOSETRON HCL INJECTION 0.25 MG/5ML
0.2500 mg | Freq: Once | INTRAVENOUS | Status: AC
  Administered 2024-05-04: 0.25 mg via INTRAVENOUS
  Filled 2024-05-04: qty 5

## 2024-05-04 MED ORDER — SODIUM CHLORIDE 0.9 % IV SOLN
1500.0000 mg | Freq: Once | INTRAVENOUS | Status: AC
  Administered 2024-05-04: 1500 mg via INTRAVENOUS
  Filled 2024-05-04: qty 30

## 2024-05-04 MED ORDER — SODIUM CHLORIDE 0.9 % IV SOLN
150.0000 mg | Freq: Once | INTRAVENOUS | Status: AC
  Administered 2024-05-04: 150 mg via INTRAVENOUS
  Filled 2024-05-04: qty 150

## 2024-05-04 NOTE — Patient Instructions (Addendum)
 [facility] - A DEPT OF [facility]  Discharge Instructions: Thank you for choosing  Cancer Center to provide your oncology and hematology care.   If you have a lab appointment with the Cancer Center, please go directly to the Cancer Center and check in at the registration area.   Wear comfortable clothing and clothing appropriate for easy access to any Portacath or PICC line.   We strive to give you quality time with your provider. You may need to reschedule your appointment if you arrive late (15 or more minutes).  Arriving late affects you and other patients whose appointments are after yours.  Also, if you miss three or more appointments without notifying the office, you may be dismissed from the clinic at the provider's discretion.      For prescription refill requests, have your pharmacy contact our office and allow 72 hours for refills to be completed.    Today you received the following chemotherapy and/or immunotherapy agents: Durvalumab (Imfinzi), Gemcitabine (Gemzar), & Cisplatin (Platinol)    To help prevent nausea and vomiting after your treatment, we encourage you to take your nausea medication as directed.  BELOW ARE SYMPTOMS THAT SHOULD BE REPORTED IMMEDIATELY: *FEVER GREATER THAN 100.4 F (38 C) OR HIGHER *CHILLS OR SWEATING *NAUSEA AND VOMITING THAT IS NOT CONTROLLED WITH YOUR NAUSEA MEDICATION *UNUSUAL SHORTNESS OF BREATH *UNUSUAL BRUISING OR BLEEDING *URINARY PROBLEMS (pain or burning when urinating, or frequent urination) *BOWEL PROBLEMS (unusual diarrhea, constipation, pain near the anus) TENDERNESS IN MOUTH AND THROAT WITH OR WITHOUT PRESENCE OF ULCERS (sore throat, sores in mouth, or a toothache) UNUSUAL RASH, SWELLING OR PAIN  UNUSUAL VAGINAL DISCHARGE OR ITCHING   Items with * indicate a potential emergency and should be followed up as soon as possible or go to the Emergency Department if any problems should  occur.  Please show the CHEMOTHERAPY ALERT CARD or IMMUNOTHERAPY ALERT CARD at check-in to the Emergency Department and triage nurse.  Should you have questions after your visit or need to cancel or reschedule your appointment, please contact [facility] - A DEPT OF [facility]  Dept: [phone]  and follow the prompts.  Office hours are 8:00 a.m. to 4:30 p.m. Monday - Friday. Please note that voicemails left after 4:00 p.m. may not be returned until the following business day.  We are closed weekends and major holidays. You have access to a nurse at all times for urgent questions. Please call the main number to the clinic Dept: [phone] and follow the prompts.   For any non-urgent questions, you may also contact your provider using MyChart. We now offer e-Visits for anyone 59 and older to request care online for non-urgent symptoms. For details visit [email or web address].   Also download the MyChart app! Go to the app store, search MyChart, open the app, select , and log in with your MyChart username and password.

## 2024-05-04 NOTE — Progress Notes (Signed)
 [facility] Spiritual Care Note  Made initial visit in infusion to introduce Spiritual Care as part of [name]'s support team. She was very appreciative of support and welcomes a follow-up call in ca two weeks. She also has direct Spiritual Care number, in case needs arise in the meantime.  [address] [name], [place], [facility] Pager [phone] Voicemail [phone]

## 2024-05-04 NOTE — Progress Notes (Signed)
 Pt observed for 60 minutes post Imfinzi  infusion prior to the start of Gemzar  infusion on D1C1.  Pt tolerated Tx well w/out incident. VSS after observation period.

## 2024-05-05 ENCOUNTER — Telehealth: Payer: Self-pay

## 2024-05-05 NOTE — Telephone Encounter (Signed)
 LM for patient that this nurse was calling to see how they were doing after their treatment. Please call back to Dr. Latanya Maudlin nurse at [phone] if they have any questions or concerns regarding the treatment.

## 2024-05-05 NOTE — Telephone Encounter (Signed)
-----   Message from Nurse Elenor ORN, RN sent at 05/04/2024  3:51 PM EST ----- Regarding: Dr. Lanny 1st time Imfinzi /Gemzar /Cisplatin  f/u Pt tol well Dr. Lanny 1st time Imfinzi /Gemzar /Cisplatin , Pt tolerated tx well without incident. Pt call back due.

## 2024-05-06 ENCOUNTER — Other Ambulatory Visit: Payer: Self-pay

## 2024-05-06 ENCOUNTER — Telehealth: Payer: Self-pay

## 2024-05-06 NOTE — Telephone Encounter (Signed)
 Received telephone call from the patient c/o of mild pain and discomfort. Patient stated her temp is 99.1*F.  Patient inquiring if she can take 500mg  of Tylenol .  Spoke with Anders, RN. Let patient know it is okay for her to take the Tylenol  for pain/discomfort.  Patient voiced understanding.

## 2024-05-10 ENCOUNTER — Other Ambulatory Visit: Payer: Self-pay

## 2024-05-10 ENCOUNTER — Telehealth: Payer: Self-pay

## 2024-05-10 NOTE — Telephone Encounter (Signed)
 LVM for pt to return my phone call regarding  her FMLA form. Left call back number.

## 2024-05-10 NOTE — Assessment & Plan Note (Signed)
 Stage IV with liver metastasis  -Diagnosed in Dec 2025.  - She presented with a right upper quadrant abdominal pain, CT scan showed an inflamed gallbladder, and a 1.2 cm hepatic lesion which was not seen on the subsequent MRI. -She underwent laparoscopic a partial cholecystectomy and liver biopsy on April 08, 2024 with postop drain placement.  Surgical path showed adenocarcinoma with liver met.  -she started chemo cisplatin , gemcitabine  and durvalumab  on 05/05/2023

## 2024-05-11 ENCOUNTER — Encounter: Payer: Self-pay | Admitting: Hematology

## 2024-05-11 ENCOUNTER — Inpatient Hospital Stay: Admitting: Hematology

## 2024-05-11 ENCOUNTER — Inpatient Hospital Stay

## 2024-05-11 ENCOUNTER — Other Ambulatory Visit: Payer: Self-pay

## 2024-05-11 VITALS — BP 136/76 | HR 126 | Temp 97.6°F | Resp 19 | Ht 69.0 in | Wt 123.8 lb

## 2024-05-11 DIAGNOSIS — C23 Malignant neoplasm of gallbladder: Secondary | ICD-10-CM

## 2024-05-11 DIAGNOSIS — Z5112 Encounter for antineoplastic immunotherapy: Secondary | ICD-10-CM | POA: Diagnosis not present

## 2024-05-11 LAB — CBC WITH DIFFERENTIAL (CANCER CENTER ONLY)
Abs Immature Granulocytes: 0.01 K/uL (ref 0.00–0.07)
Basophils Absolute: 0 K/uL (ref 0.0–0.1)
Basophils Relative: 0 %
Eosinophils Absolute: 0.1 K/uL (ref 0.0–0.5)
Eosinophils Relative: 1 %
HCT: 37.1 % (ref 36.0–46.0)
Hemoglobin: 13 g/dL (ref 12.0–15.0)
Immature Granulocytes: 0 %
Lymphocytes Relative: 29 %
Lymphs Abs: 1.9 K/uL (ref 0.7–4.0)
MCH: 30.3 pg (ref 26.0–34.0)
MCHC: 35 g/dL (ref 30.0–36.0)
MCV: 86.5 fL (ref 80.0–100.0)
Monocytes Absolute: 0.2 K/uL (ref 0.1–1.0)
Monocytes Relative: 4 %
Neutro Abs: 4.3 K/uL (ref 1.7–7.7)
Neutrophils Relative %: 66 %
Platelet Count: 170 K/uL (ref 150–400)
RBC: 4.29 MIL/uL (ref 3.87–5.11)
RDW: 12.3 % (ref 11.5–15.5)
WBC Count: 6.5 K/uL (ref 4.0–10.5)
nRBC: 0 % (ref 0.0–0.2)

## 2024-05-11 LAB — CMP (CANCER CENTER ONLY)
ALT: 48 U/L — ABNORMAL HIGH (ref 0–44)
AST: 38 U/L (ref 15–41)
Albumin: 4.3 g/dL (ref 3.5–5.0)
Alkaline Phosphatase: 109 U/L (ref 38–126)
Anion gap: 15 (ref 5–15)
BUN: 11 mg/dL (ref 6–20)
CO2: 26 mmol/L (ref 22–32)
Calcium: 9.4 mg/dL (ref 8.9–10.3)
Chloride: 96 mmol/L — ABNORMAL LOW (ref 98–111)
Creatinine: 0.8 mg/dL (ref 0.44–1.00)
GFR, Estimated: >60 mL/min
Glucose, Bld: 164 mg/dL — ABNORMAL HIGH (ref 70–99)
Potassium: 3.7 mmol/L (ref 3.5–5.1)
Sodium: 136 mmol/L (ref 135–145)
Total Bilirubin: 0.5 mg/dL (ref 0.0–1.2)
Total Protein: 8.2 g/dL — ABNORMAL HIGH (ref 6.5–8.1)

## 2024-05-11 LAB — MAGNESIUM: Magnesium: 2 mg/dL (ref 1.7–2.4)

## 2024-05-11 NOTE — Progress Notes (Signed)
 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  - She presented with a right upper quadrant abdominal pain, CT scan showed an inflamed gallbladder, and a 1.2 cm hepatic lesion which was not seen on the subsequent MRI. -She underwent laparoscopic a partial cholecystectomy and liver biopsy on April 08, 2024 with postop drain placement.  Surgical path showed adenocarcinoma with liver met.  -she started chemo cisplatin , gemcitabine  and durvalumab  on 05/05/2023  Assessment & Plan Gallbladder cancer Localized gallbladder carcinoma involving adjacent liver, without nodal or distant metastasis on recent PET scan. She is undergoing chemotherapy. Tumor marker remains within normal limits and is not a reliable indicator for disease monitoring. Insurance denied [facility] genetic testing, but she remains eligible for further genetic evaluation. She is scheduled for a surgical second opinion and ongoing genetic counseling. - Continue current chemotherapy regimen. - Ordered Signatera testing for molecular monitoring of disease status. - Plan to repeat imaging in 2-3 months to assess response to chemotherapy. - Referred to genetics for counseling and further testing; appointment scheduled for February 13. - Advised her to notify if she receives any bills related to genetic testing.  Chemotherapy-induced gastrointestinal symptoms Experiencing significant chemotherapy-induced gastrointestinal toxicity, including constipation, bloating, cramping, and decreased appetite. No  diarrhea or emesis. Constipation and bloating are temporally associated with chemotherapy and premedications. She manages symptoms with laxatives and simethicone, but continues to have discomfort and cramping. She reported transient palpitations and anxiety, which resolved spontaneously. - Initiated Miralax  and Senokot, to continue for 3-5 days post-chemotherapy, with instructions to discontinue if diarrhea develops. - Recommended Colace daily for up to 5 days post-chemotherapy. - Advised use of Gas-X as needed for bloating and gas symptoms. - Provided anticipatory guidance regarding timing of constipation and diarrhea related to chemotherapy and premedications. - Encouraged symptom monitoring and adjustment of laxative use accordingly.  Nutritional support and weight loss She has lost weight from 131-133 lbs to 118 lbs, with poor appetite and intolerance to oral intake, including protein supplements, contributing to gastrointestinal discomfort. She is at risk for further weight loss and malnutrition during ongoing chemotherapy. - Encouraged use of protein shakes and supplements (Ensure, Boost, or alternatives), and to try different flavors or brands to improve tolerance. - Suggested homemade high-calorie shakes with protein powder, yogurt, banana, and peanut butter as tolerated. - Reinforced importance of maintaining adequate caloric and protein intake. - Confirmed scheduled appointment with dietitian for further nutritional support.  Postoperative care following cholecystectomy She is recovering from recent cholecystectomy. Incisions are healing appropriately without infection or dehiscence. She continues to experience mild abdominal discomfort, likely due to postoperative inflammation, scar tissue, and ongoing gastrointestinal symptoms. Recent surgical evaluation revealed no acute concerns. - Advised to continue activity modification to allow for internal healing. - Reassured that mild abdominal  discomfort and bloating are common postoperatively, especially in the context of recent surgery and ongoing chemotherapy. - Confirmed incisions are healing well and no further intervention is required at this time.  Plan - She had moderate side effect from first cycle chemo, has recovered well. - Lab reviewed, adequate for  treatment, will proceed to second cycle chemo tomorrow at the same dose.  She will take a laxative for 3 to 5 days starting tomorrow to avoid chemo induced constipation - Follow-up in 2 weeks before cycle 3 chemo - She is scheduled for genetic counseling - She would like to see Dr. Debora at [facility] for second surgical opinion.   SUMMARY OF ONCOLOGIC HISTORY: Oncology History  Gallbladder cancer (HCC)  04/08/2024 Cancer Staging   Staging form: Gallbladder, AJCC 8th Edition - Pathologic stage from 04/08/2024: Stage IVB (pT2, pN0, pM1) - Signed by Lanny Callander, MD on 04/15/2024 Total positive nodes: 0 Histologic grade (G): G2 Histologic grading system: 3 grade system Residual tumor (R): RX   04/15/2024 Initial Diagnosis   Gallbladder cancer (HCC)   04/20/2024 PET scan   IMPRESSION: 1. Hypermetabolic gallbladder wall thickening post partial cholecystectomy, extending from the body of the gallbladder to the gallbladder neck. 2. Small Focal metabolic activity in the adjacent liver margin most suggestive of local extension into the liver parenchyma by gallbladder carcinoma. 3. No additional hypermetabolic foci within the liver. 4. No hypermetabolic periportal lymph nodes or other hypermetabolic metastatic disease.     05/04/2024 -  Chemotherapy   Patient is on Treatment Plan : BILIARY TRACT Cisplatin  + Gemcitabine  D1,8 + Durvalumab  (1500) D1 q21d / Durvalumab  (1500) q28d        Discussed the use of AI scribe software for clinical note transcription with the patient, who gave verbal consent to proceed.  History of Present Illness [name] is a 49  year old female with gallbladder cancer, status post cholecystectomy, currently receiving chemotherapy, who presents for follow-up of treatment-related symptoms and nutritional concerns.  After her first chemotherapy cycle she developed severe constipation with bloating, cramping, and abdominal pain starting immediately after infusion. Miralax , Senokot, and Colace gave partial relief but increased cramping and bowel movements. Gas-X improved bloating. She denies diarrhea or vomiting. She had mild nausea last night that responded to antiemetics.  Since starting chemotherapy her appetite has markedly decreased and she has difficulty tolerating solid foods. Last night she only ate a small amount of chicken and a banana due to pain and nausea and required oxycodone  for pain control. Her weight has fallen from about 131-133 lb to 118 lb at home. She uses Ensure protein shakes up to three times daily, though these worsen constipation and bloating. She is scheduled to see a dietitian for nutritional support.  She had two brief episodes of self-limited heart palpitations that resolved within minutes. She links these to new anxiety related to her health.  She is recovering from recent cholecystectomy and notes ongoing internal discomfort near the surgical site, which she relates to gas and constipation, and is worried about internal healing.  Recent PET showed abnormalities in the gallbladder region and adjacent liver. Tumor marker remains normal. Insurance denied two molecular pathology tests, but she will proceed with genetic counseling in February. She has a planned surgical second opinion with Dr. Thom at [facility].     All other systems were reviewed with the patient and are negative.  MEDICAL HISTORY:  Past Medical History:  Diagnosis Date   Allergy     SURGICAL HISTORY: Past Surgical History:  Procedure Laterality Date   CESAREAN SECTION     CHOLECYSTECTOMY N/A 04/08/2024   Procedure:  LAPAROSCOPIC CHOLECYSTECTOMY;  Surgeon: Dasie Leonor CROME, MD;  Location: [facility] OR;  Service: General;  Laterality: N/A;   IR IMAGING GUIDED PORT INSERTION  04/27/2024   MOLE REMOVAL     OPEN PARTIAL HEPATECTOMY  N/A 04/08/2024   Procedure: HEPATECTOMY, PARTIAL, OPEN;  Surgeon: Dasie Leonor CROME, MD;  Location: [facility] OR;  Service: General;  Laterality: N/A;    I have reviewed the social history and family history with the patient and they are unchanged from previous note.  ALLERGIES:  is allergic to shellfish allergy.  MEDICATIONS:  Current Outpatient Medications  Medication Sig Dispense Refill   acetaminophen  (TYLENOL ) 500 MG tablet Take 500-1,000 mg by mouth every 6 (six) hours as needed (pain.).     folic acid (FOLVITE) 1 MG tablet Take 1 mg by mouth at bedtime.  3   ibuprofen (ADVIL) 200 MG tablet Take 200-400 mg by mouth every 8 (eight) hours as needed (pain.).     JUNEL FE 1/20 1-20 MG-MCG tablet Take 1 tablet by mouth at bedtime.     lidocaine -prilocaine  (EMLA ) cream Apply to affected area once 30 g 3   Multiple Vitamin (MULTIVITAMIN WITH MINERALS) TABS tablet Take 1 tablet by mouth in the morning.     ondansetron  (ZOFRAN ) 8 MG tablet Take 1 tablet (8 mg total) by mouth every 8 (eight) hours as needed for nausea or vomiting. Start on the third day after cisplatin . 30 tablet 1   prochlorperazine  (COMPAZINE ) 10 MG tablet Take 1 tablet (10 mg total) by mouth every 6 (six) hours as needed (Nausea or vomiting). 30 tablet 1   No current facility-administered medications for this visit.    PHYSICAL EXAMINATION: ECOG PERFORMANCE STATUS: 1 - Symptomatic but completely ambulatory  Vitals:   05/11/24 1101  BP: 136/76  Pulse: (!) 126  Resp: 19  Temp: 97.6 F (36.4 C)  SpO2: 99%   Wt Readings from Last 3 Encounters:  05/11/24 123 lb 12.8 oz (56.2 kg)  05/04/24 126 lb 6 oz (57.3 kg)  05/03/24 126 lb 4.8 oz (57.3 kg)     GENERAL:alert, no distress and comfortable SKIN: skin color, texture,  turgor are normal, no rashes or significant lesions EYES: normal, Conjunctiva are pink and non-injected, sclera clear NECK: supple, thyroid  normal size, non-tender, without nodularity LYMPH:  no palpable lymphadenopathy in the cervical, axillary  LUNGS: clear to auscultation and percussion with normal breathing effort HEART: regular rate & rhythm and no murmurs and no lower extremity edema ABDOMEN:abdomen soft, non-tender and normal bowel sounds Musculoskeletal:no cyanosis of digits and no clubbing  NEURO: alert & oriented x 3 with fluent speech, no focal motor/sensory deficits  Physical Exam MEASUREMENTS: Weight- 118.  LABORATORY DATA:  I have reviewed the data as listed    Latest Ref Rng & Units 05/11/2024   10:22 AM 05/03/2024    1:06 PM 04/09/2024    4:28 AM  CBC  WBC 4.0 - 10.5 K/uL 6.5  8.0  10.1   Hemoglobin 12.0 - 15.0 g/dL 86.9  87.2  87.9   Hematocrit 36.0 - 46.0 % 37.1  37.4  34.1   Platelets 150 - 400 K/uL 170  271  252         Latest Ref Rng & Units 05/11/2024   10:22 AM 05/03/2024    1:06 PM 04/15/2024    3:22 PM  CMP  Glucose 70 - 99 mg/dL 835  867  97   BUN 6 - 20 mg/dL 11  12  11    Creatinine 0.44 - 1.00 mg/dL 9.19  9.33  9.17   Sodium 135 - 145 mmol/L 136  135  137  Potassium 3.5 - 5.1 mmol/L 3.7  3.7  3.8   Chloride 98 - 111 mmol/L 96  98  100   CO2 22 - 32 mmol/L 26  27  23    Calcium 8.9 - 10.3 mg/dL 9.4  9.4  9.5   Total Protein 6.5 - 8.1 g/dL 8.2  8.1  8.5   Total Bilirubin 0.0 - 1.2 mg/dL 0.5  0.6  0.4   Alkaline Phos 38 - 126 U/L 109  106  97   AST 15 - 41 U/L 38  26  32   ALT 0 - 44 U/L 48  23  33       RADIOGRAPHIC STUDIES: I have personally reviewed the radiological images as listed and agreed with the findings in the report. No results found.    No orders of the defined types were placed in this encounter.  All questions were answered. The patient knows to call the clinic with any problems, questions or concerns. No barriers to learning  was detected. The total time spent in the appointment was 30 minutes, including review of chart and various tests results, discussions about plan of care and coordination of care plan     Onita Mattock, MD 05/11/2024     "

## 2024-05-11 NOTE — Progress Notes (Signed)
" ° °  PROVIDER:  LEONOR MACARIO DAWN, MD  MRN: [id number] DOB: [birth year] DATE OF ENCOUNTER: 05/11/2024 Interval History:     [name] presents for continued follow up. She underwent a laparoscopic partial fenestrating cholecystectomy, abscess drainage and liver biopsy on 12/12. She was seen for follow up and drain removal on 12/23, at which time she was doing well and feeling much better. Her pathology confirmed gallbladder adenocarcinoma with a separate liver lesion, and she started chemotherapy (gemcitabine /cisplatin  with immunotherapy) and has completed one cycle. She sees Dr. Lanny again today. She has been having constipation for the last few weeks. She has had intermittent pain which usually improves after having a bowel movement. She also still has some abdominal pain with certain movements and activity.     Physical Examination:   Physical Exam Constitutional:      General: She is not in acute distress.    Appearance: Normal appearance.  Pulmonary:     Effort: Pulmonary effort is normal. No respiratory distress.  Abdominal:     General: There is no distension.     Palpations: Abdomen is soft.     Tenderness: There is no abdominal tenderness.     Comments: Incisions are well-healed with no erythema, induration or drainage.  Neurological:     General: No focal deficit present.     Mental Status: She is alert and oriented to person, place, and time.         Assessment and Plan:     [name] is a 49 y.o. female who underwent diagnostic laparoscopy, partial fenestrating cholecystectomy and liver biopsy on 04/08/24. Path and liver biopsy confirmed gallbladder adenocarcinoma with a metastatic liver lesion, and intraoperative findings showed involvement of the duodenum and transverse colon. She has started systemic treatment. Her pain seems to be incisional pain related to increased activity, as well as pain associated with constipation. She has not had fevers or chills. Her  initial HR was elevated today to the 140s, however she reported anxiety and had climbed the stairs to her appointment. Repeat HR at rest was improved. My clinical suspicion for a delayed bile leak is low, but we will check LFTs and CBC today.   For her constipation she should take daily Miralax , and increase to BID if needed, and ensure plenty of water intake. She may also take colace and senna.  She is to continue on systemic treatment and has an upcoming appointment with Dr. Debora at [facility] for a second surgical opinion.   I will call her with her lab results.   Diagnoses and all orders for this visit:  Adenocarcinoma of gallbladder (CMS/HHS-HCC) -     CBC with Diff, Platelet, NLR - [facility] -     CMP14+eGFR - [facility]  Encounter for follow-up examination        Return if symptoms worsen or fail to improve.   The plan was discussed in detail with the patient today, who expressed understanding.  The patient has my contact information, and understands to call me with any additional questions or concerns in the interval.  I would be happy to see the patient back sooner if the need arises.   SHELBY LYNN ALLEN, MD  "

## 2024-05-11 NOTE — Progress Notes (Signed)
 As per Dr. Onita Mattock, order was placed successfully in portal for Signatera to be drawn 05/24/2024. Kit and paperwork was taken to lab for the test to be drawn in flush. Paperwork was uploaded with the order.

## 2024-05-12 ENCOUNTER — Other Ambulatory Visit: Payer: Self-pay

## 2024-05-12 ENCOUNTER — Other Ambulatory Visit: Payer: Self-pay | Admitting: Hematology

## 2024-05-12 ENCOUNTER — Inpatient Hospital Stay: Admitting: Dietician

## 2024-05-12 ENCOUNTER — Inpatient Hospital Stay

## 2024-05-12 ENCOUNTER — Telehealth: Payer: Self-pay

## 2024-05-12 VITALS — BP 131/77 | HR 95 | Temp 98.4°F | Resp 18

## 2024-05-12 DIAGNOSIS — Z5112 Encounter for antineoplastic immunotherapy: Secondary | ICD-10-CM | POA: Diagnosis not present

## 2024-05-12 DIAGNOSIS — C23 Malignant neoplasm of gallbladder: Secondary | ICD-10-CM

## 2024-05-12 LAB — CANCER ANTIGEN 19-9: CA 19-9: 14 U/mL (ref 0–35)

## 2024-05-12 MED ORDER — SODIUM CHLORIDE 0.9 % IV SOLN
150.0000 mg | Freq: Once | INTRAVENOUS | Status: AC
  Administered 2024-05-12: 150 mg via INTRAVENOUS
  Filled 2024-05-12: qty 150

## 2024-05-12 MED ORDER — DEXAMETHASONE SOD PHOSPHATE PF 10 MG/ML IJ SOLN
10.0000 mg | Freq: Once | INTRAMUSCULAR | Status: AC
  Administered 2024-05-12: 10 mg via INTRAVENOUS
  Filled 2024-05-12: qty 1

## 2024-05-12 MED ORDER — SODIUM CHLORIDE 0.9 % IV SOLN
25.0000 mg/m2 | Freq: Once | INTRAVENOUS | Status: AC
  Administered 2024-05-12: 42 mg via INTRAVENOUS
  Filled 2024-05-12: qty 42

## 2024-05-12 MED ORDER — SODIUM CHLORIDE 0.9 % IV SOLN
1000.0000 mg/m2 | Freq: Once | INTRAVENOUS | Status: AC
  Administered 2024-05-12: 1672 mg via INTRAVENOUS
  Filled 2024-05-12: qty 43.97

## 2024-05-12 MED ORDER — TRAMADOL HCL 50 MG PO TABS: 50.0000 mg | ORAL_TABLET | Freq: Two times a day (BID) | ORAL | 0 refills | Status: DC | PRN

## 2024-05-12 MED ORDER — SODIUM CHLORIDE 0.9 % IV SOLN: INTRAVENOUS | Status: DC

## 2024-05-12 MED ORDER — PALONOSETRON HCL INJECTION 0.25 MG/5ML
0.2500 mg | Freq: Once | INTRAVENOUS | Status: AC
  Administered 2024-05-12: 0.25 mg via INTRAVENOUS
  Filled 2024-05-12: qty 5

## 2024-05-12 MED ORDER — POTASSIUM CHLORIDE IN NACL 20-0.9 MEQ/L-% IV SOLN
Freq: Once | INTRAVENOUS | Status: AC
  Filled 2024-05-12: qty 1000

## 2024-05-12 MED ORDER — MAGNESIUM SULFATE 2 GM/50ML IV SOLN
2.0000 g | Freq: Once | INTRAVENOUS | Status: AC
  Administered 2024-05-12: 2 g via INTRAVENOUS
  Filled 2024-05-12: qty 50

## 2024-05-12 NOTE — Progress Notes (Signed)
 Nutrition Assessment   Reason for Assessment: Referral per Dr. Lanny   ASSESSMENT: 49 year old female with no significant past medical history diagnosed with stage IV gallbladder cancer metastatic to liver. S/p laparoscopic partial cholecystectomy 12/12. She is receiving neoadjuvant chemotherapy with cisplatin , gemcitabine , + durvalumab . Patient is under the care of Dr. Lanny  Met with patient in infusion. She is accompanied by her dear friend today who is present at visit. Patient reports tolerating first therapy well until day 3. Patient was not expecting to have constipation. Due to recent abdominal surgery, this was concerning to her. She is now on bowl regimen (daily miralax  + colace as needed). Patient reports nausea without vomiting and poor appetite. Stopped drinking Ensure  and eating ice cream as she thought this was contributing to constipation. Patient trying to incorporate small meals/snacks. Yesterday had cheerios with banana, steak, rice, spinach for lunch, and leftover steak with spinach and sweet potato for dinner. Today patient has had plain sweet potato, bowl of cereal, humus/pretzels, saltines with cheese slice. Patient drinks a lot of water.   Nutrition Focused Physical Exam: deferred    Medications: folic acid, junel fe, MVI, zofran , compazine , tramadol     Labs: glucose 164   Anthropometrics:   Height: 5'9 Weight: 123 lb 12.8 oz  UBW: 133 lb (9/25) BMI: 18.28   Estimated Energy Needs  Kcals: 1700-1970 Protein: 73-90 Fluid: > 1.7 L   NUTRITION DIAGNOSIS: Unintended wt loss related to cancer and associated treatment related side effects as evidenced by constipation, nausea, anorexia, 2.4% wt loss in 2 weeks which is clinically severe for time frame. Pt weighed 126 lb 6 oz on 1/7   INTERVENTION:  Discussed importance of adequate calorie/protein energy intake to minimize wt loss/maintain strength Encourage small frequent meals q3h - recommend protein source at  every meal (snack ideas, shake recipes, soft moist high protein foods list)  Educated on strategies for nausea - foods best tolerated and foods to avoid Continue bowel regimen - recommend sitting upright for one hour after po to support digestion Pt agreeable to try Ensure again - recommend 2/day as tolerated (samples of CIB powder + Ensure Clear provided for pt to try)  Encourage daily body movement  Contact information   MONITORING, EVALUATION, GOAL: wt trends, intake   Next Visit: Wednesday February 4 during infusion

## 2024-05-12 NOTE — Telephone Encounter (Signed)
 Pt FMLA document was handed to her friend, who is here with her today for her appointment, permission was given yesterday per pt in person.

## 2024-05-16 NOTE — Progress Notes (Signed)
 PATIENT NAVIGATOR PROGRESS NOTE  Name: [name] Date: 05/16/2024 MRN: [id number]  DOB: 05-11-1975   Patient is established with a treatment plan and is actively engaged in care. Nurse Navigator services not currently indicated at this time. Will re-evaluate if needs change or if additional support is requested.

## 2024-05-18 ENCOUNTER — Encounter: Payer: Self-pay | Admitting: General Practice

## 2024-05-18 NOTE — Progress Notes (Signed)
 [facility] Spiritual Care Note  [name] briefly by phone. We plan to follow up in infusion next week, and she has direct Spiritual Care number as well.  [address] [name], [place], [facility] Pager [phone] Voicemail [phone]

## 2024-05-19 ENCOUNTER — Other Ambulatory Visit: Payer: Self-pay

## 2024-05-20 ENCOUNTER — Other Ambulatory Visit: Payer: Self-pay

## 2024-05-20 NOTE — Addendum Note (Signed)
 Encounter addended by: Janice Lynwood BROCKS on: 05/20/2024 9:05 AM  Actions taken: Imaging Exam ended

## 2024-05-22 ENCOUNTER — Other Ambulatory Visit: Payer: Self-pay

## 2024-05-22 ENCOUNTER — Observation Stay (HOSPITAL_COMMUNITY)
Admission: EM | Admit: 2024-05-22 | Discharge: 2024-05-23 | Disposition: A | Source: Ambulatory Visit | Attending: Internal Medicine | Admitting: Internal Medicine

## 2024-05-22 ENCOUNTER — Encounter (HOSPITAL_COMMUNITY): Payer: Self-pay | Admitting: Emergency Medicine

## 2024-05-22 DIAGNOSIS — R1011 Right upper quadrant pain: Principal | ICD-10-CM | POA: Insufficient documentation

## 2024-05-22 DIAGNOSIS — R110 Nausea: Secondary | ICD-10-CM | POA: Diagnosis not present

## 2024-05-22 DIAGNOSIS — R109 Unspecified abdominal pain: Secondary | ICD-10-CM | POA: Diagnosis present

## 2024-05-22 DIAGNOSIS — C787 Secondary malignant neoplasm of liver and intrahepatic bile duct: Secondary | ICD-10-CM | POA: Diagnosis not present

## 2024-05-22 DIAGNOSIS — R739 Hyperglycemia, unspecified: Secondary | ICD-10-CM | POA: Diagnosis not present

## 2024-05-22 DIAGNOSIS — Z8509 Personal history of malignant neoplasm of other digestive organs: Secondary | ICD-10-CM | POA: Diagnosis not present

## 2024-05-22 DIAGNOSIS — C23 Malignant neoplasm of gallbladder: Secondary | ICD-10-CM | POA: Insufficient documentation

## 2024-05-22 HISTORY — DX: Malignant (primary) neoplasm, unspecified: C80.1

## 2024-05-22 LAB — COMPREHENSIVE METABOLIC PANEL WITH GFR
ALT: 32 U/L (ref 0–44)
AST: 27 U/L (ref 15–41)
Albumin: 4.3 g/dL (ref 3.5–5.0)
Alkaline Phosphatase: 110 U/L (ref 38–126)
Anion gap: 13 (ref 5–15)
BUN: 10 mg/dL (ref 6–20)
CO2: 24 mmol/L (ref 22–32)
Calcium: 9.7 mg/dL (ref 8.9–10.3)
Chloride: 100 mmol/L (ref 98–111)
Creatinine, Ser: 0.79 mg/dL (ref 0.44–1.00)
GFR, Estimated: >60 mL/min
Glucose, Bld: 133 mg/dL — ABNORMAL HIGH (ref 70–99)
Potassium: 3.7 mmol/L (ref 3.5–5.1)
Sodium: 137 mmol/L (ref 135–145)
Total Bilirubin: 0.3 mg/dL (ref 0.0–1.2)
Total Protein: 7.9 g/dL (ref 6.5–8.1)

## 2024-05-22 LAB — URINALYSIS, ROUTINE W REFLEX MICROSCOPIC
Bilirubin Urine: NEGATIVE
Glucose, UA: NEGATIVE mg/dL
Hgb urine dipstick: NEGATIVE
Ketones, ur: NEGATIVE mg/dL
Leukocytes,Ua: NEGATIVE
Nitrite: NEGATIVE
Protein, ur: NEGATIVE mg/dL
Specific Gravity, Urine: 1.02 (ref 1.005–1.030)
pH: 5 (ref 5.0–8.0)

## 2024-05-22 LAB — LIPASE, BLOOD: Lipase: 18 U/L (ref 11–51)

## 2024-05-22 MED ORDER — HYDROMORPHONE HCL 1 MG/ML IJ SOLN
0.5000 mg | Freq: Once | INTRAMUSCULAR | Status: AC
  Administered 2024-05-22: 0.5 mg via INTRAVENOUS
  Filled 2024-05-22: qty 1

## 2024-05-22 MED ORDER — LACTATED RINGERS IV BOLUS
1000.0000 mL | Freq: Once | INTRAVENOUS | Status: AC
  Administered 2024-05-22: 1000 mL via INTRAVENOUS

## 2024-05-22 MED ORDER — ONDANSETRON HCL 4 MG/2ML IJ SOLN
4.0000 mg | Freq: Once | INTRAMUSCULAR | Status: AC
  Administered 2024-05-22: 4 mg via INTRAVENOUS
  Filled 2024-05-22: qty 2

## 2024-05-22 NOTE — ED Triage Notes (Signed)
 Pt c/o lower back pain that started today. Pt denies vomiting or urinary symptoms, endorses nausea. Pt is a cancer pt.

## 2024-05-22 NOTE — ED Provider Notes (Signed)
 " [facility]PT [facility] Emergency Department Provider Note MRN:  [id number]  Arrival date & time: 05/23/24     Chief Complaint   Back Pain and Nausea   History of Present Illness   [name] is a 49 y.o. year-old female presents to the ED with chief complaint of worsening RUQ abdominal pain.  Hx of gallbladder adenocarcinoma.  She underwent a laparoscopic partial fenestrating cholecystectomy, abscess drainage and liver biopsy on 12/12. She was seen for follow up and drain removal on 12/23, at which time she was doing well and feeling much better. Her pathology confirmed gallbladder adenocarcinoma with a separate liver lesion, and she started chemotherapy (gemcitabine /cisplatin  with immunotherapy) .  Patient reports worsening pain in the RUQ that radiates into her back.  States that she is normally able to control her symptoms at home with Tylenol  and Zofran , but wasn't able to get relief today.  States that this is worse than normal.  She denies fever or chills.  Denies vomiting or diarrhea.   History provided by patient.   Review of Systems  Pertinent positive and negative review of systems noted in HPI.    Physical Exam   Vitals:   05/22/24 2240 05/23/24 0039  BP: (!) 140/110 (!) 139/91  Pulse: (!) 128 98  Resp: 18 16  Temp: 98.5 F (36.9 C)   SpO2: 98% 100%    CONSTITUTIONAL:  non toxic-appearing, NAD NEURO:  Alert and oriented x 3, CN 3-12 grossly intact EYES:  eyes equal and reactive ENT/NECK:  Supple, no stridor  CARDIO:  tachycardic, regular rhythm, appears well-perfused  PULM:  No respiratory distress, CTAB GI/GU:  non-distended, mild RUQ abdominal tenderness MSK/SPINE:  No gross deformities, no edema, moves all extremities  SKIN:  no rash, atraumatic   *Additional and/or pertinent findings included in MDM below  Diagnostic and Interventional Summary    EKG Interpretation Date/Time:    Ventricular Rate:    PR Interval:    QRS  Duration:    QT Interval:    QTC Calculation:   R Axis:      Text Interpretation:         Labs Reviewed  COMPREHENSIVE METABOLIC PANEL WITH GFR - Abnormal; Notable for the following components:      Result Value   Glucose, Bld 133 (*)    All other components within normal limits  CBC WITH DIFFERENTIAL/PLATELET - Abnormal; Notable for the following components:   HCT 35.8 (*)    All other components within normal limits  URINALYSIS, ROUTINE W REFLEX MICROSCOPIC - Abnormal; Notable for the following components:   APPearance HAZY (*)    All other components within normal limits  LIPASE, BLOOD  HCG, SERUM, QUALITATIVE    CT ABDOMEN PELVIS W CONTRAST  Final Result      Medications  HYDROmorphone  (DILAUDID ) injection 0.5 mg (0.5 mg Intravenous Given 05/22/24 2306)  ondansetron  (ZOFRAN ) injection 4 mg (4 mg Intravenous Given 05/22/24 2305)  lactated ringers  bolus 1,000 mL (0 mLs Intravenous Stopped 05/23/24 0039)  iohexol  (OMNIPAQUE ) 300 MG/ML solution 100 mL (100 mLs Intravenous Contrast Given 05/23/24 0032)     Procedures  /  Critical Care Procedures  ED Course and Medical Decision Making  I have reviewed the triage vital signs, the nursing notes, and pertinent available records from the EMR.  Social Determinants Affecting Complexity of Care: Patient has no clinically significant social determinants affecting this chief complaint..   ED Course:    Medical Decision Making Patient here  with right upper abdominal pain.  Hx of adenocarcinoma of the gallbladder.  Had worsening pain in RUQ and back today.  Has had associated nausea, but no vomiting.  CT shows possible infiltration/stricture of the transverse colon adjacent to gallbladder fossa mass concerning for obstruction.  Will consult with general surgery.    Amount and/or Complexity of Data Reviewed Labs: ordered. Radiology: ordered.  Risk Prescription drug management. Decision regarding hospitalization.          Consultants: I consulted with Dr. Tanda, who recommends medicine admit. I consulted with Dr. Shona, who is appreciated for admitting.   Treatment and Plan: Patient's exam and diagnostic results are concerning for bowel obstruction, abdominal pain.  Feel that patient will need admission to the hospital for further treatment and evaluation.    Final Clinical Impressions(s) / ED Diagnoses     ICD-10-CM   1. Right upper quadrant abdominal pain  R10.11       ED Discharge Orders     None         Discharge Instructions Discussed with and Provided to Patient:   Discharge Instructions   None      Vicky Lamar RIGGERS 05/23/24 0203    Palumbo, April, MD 05/23/24 0225  "

## 2024-05-23 ENCOUNTER — Emergency Department (HOSPITAL_COMMUNITY)

## 2024-05-23 ENCOUNTER — Observation Stay (HOSPITAL_COMMUNITY)

## 2024-05-23 DIAGNOSIS — R109 Unspecified abdominal pain: Secondary | ICD-10-CM | POA: Diagnosis present

## 2024-05-23 LAB — CBC WITH DIFFERENTIAL/PLATELET
Abs Immature Granulocytes: 0.02 10*3/uL (ref 0.00–0.07)
Basophils Absolute: 0 10*3/uL (ref 0.0–0.1)
Basophils Relative: 0 %
Eosinophils Absolute: 0 10*3/uL (ref 0.0–0.5)
Eosinophils Relative: 1 %
HCT: 35.8 % — ABNORMAL LOW (ref 36.0–46.0)
Hemoglobin: 12.2 g/dL (ref 12.0–15.0)
Immature Granulocytes: 0 %
Lymphocytes Relative: 27 %
Lymphs Abs: 1.6 10*3/uL (ref 0.7–4.0)
MCH: 30.4 pg (ref 26.0–34.0)
MCHC: 34.1 g/dL (ref 30.0–36.0)
MCV: 89.3 fL (ref 80.0–100.0)
Monocytes Absolute: 0.5 10*3/uL (ref 0.1–1.0)
Monocytes Relative: 9 %
Neutro Abs: 3.7 10*3/uL (ref 1.7–7.7)
Neutrophils Relative %: 63 %
Platelets: 238 10*3/uL (ref 150–400)
RBC: 4.01 MIL/uL (ref 3.87–5.11)
RDW: 13.4 % (ref 11.5–15.5)
WBC: 5.9 10*3/uL (ref 4.0–10.5)
nRBC: 0 % (ref 0.0–0.2)

## 2024-05-23 LAB — CBC
HCT: 36.5 % (ref 36.0–46.0)
Hemoglobin: 11.7 g/dL — ABNORMAL LOW (ref 12.0–15.0)
MCH: 29.6 pg (ref 26.0–34.0)
MCHC: 32.1 g/dL (ref 30.0–36.0)
MCV: 92.4 fL (ref 80.0–100.0)
Platelets: 245 10*3/uL (ref 150–400)
RBC: 3.95 MIL/uL (ref 3.87–5.11)
RDW: 13.5 % (ref 11.5–15.5)
WBC: 5.7 10*3/uL (ref 4.0–10.5)
nRBC: 0 % (ref 0.0–0.2)

## 2024-05-23 LAB — PHOSPHORUS: Phosphorus: 4.5 mg/dL (ref 2.5–4.6)

## 2024-05-23 LAB — BASIC METABOLIC PANEL WITH GFR
Anion gap: 10 (ref 5–15)
BUN: 9 mg/dL (ref 6–20)
CO2: 28 mmol/L (ref 22–32)
Calcium: 9.8 mg/dL (ref 8.9–10.3)
Chloride: 101 mmol/L (ref 98–111)
Creatinine, Ser: 0.77 mg/dL (ref 0.44–1.00)
GFR, Estimated: >60 mL/min
Glucose, Bld: 96 mg/dL (ref 70–99)
Potassium: 4.5 mmol/L (ref 3.5–5.1)
Sodium: 139 mmol/L (ref 135–145)

## 2024-05-23 LAB — HCG, SERUM, QUALITATIVE: Preg, Serum: NEGATIVE

## 2024-05-23 LAB — MAGNESIUM: Magnesium: 2.3 mg/dL (ref 1.7–2.4)

## 2024-05-23 MED ORDER — HYDROMORPHONE HCL 1 MG/ML IJ SOLN: 0.5000 mg | INTRAMUSCULAR | Status: DC | PRN

## 2024-05-23 MED ORDER — SENNOSIDES-DOCUSATE SODIUM 8.6-50 MG PO TABS
2.0000 | ORAL_TABLET | Freq: Every day | ORAL | Status: DC
  Administered 2024-05-23: 2 via ORAL
  Filled 2024-05-23: qty 2

## 2024-05-23 MED ORDER — POLYETHYLENE GLYCOL 3350 17 G PO PACK: 17.0000 g | PACK | Freq: Every day | ORAL | Status: DC | PRN

## 2024-05-23 MED ORDER — ACETAMINOPHEN 325 MG PO TABS: 650.0000 mg | ORAL_TABLET | Freq: Four times a day (QID) | ORAL | Status: DC | PRN

## 2024-05-23 MED ORDER — LACTATED RINGERS IV SOLN: INTRAVENOUS | Status: DC

## 2024-05-23 MED ORDER — OXYCODONE HCL 5 MG PO TABS
5.0000 mg | ORAL_TABLET | ORAL | Status: DC | PRN
  Administered 2024-05-23: 5 mg via ORAL
  Filled 2024-05-23: qty 1

## 2024-05-23 MED ORDER — IOHEXOL 300 MG/ML  SOLN
100.0000 mL | Freq: Once | INTRAMUSCULAR | Status: AC | PRN
  Administered 2024-05-23: 100 mL via INTRAVENOUS

## 2024-05-23 MED ORDER — MELATONIN 5 MG PO TABS: 5.0000 mg | ORAL_TABLET | Freq: Every evening | ORAL | Status: DC | PRN

## 2024-05-23 MED ORDER — OXYCODONE HCL 5 MG PO TABS: 5.0000 mg | ORAL_TABLET | ORAL | 0 refills | Status: AC | PRN

## 2024-05-23 MED ORDER — PROCHLORPERAZINE EDISYLATE 10 MG/2ML IJ SOLN: 5.0000 mg | Freq: Four times a day (QID) | INTRAMUSCULAR | Status: DC | PRN

## 2024-05-23 MED ORDER — ENOXAPARIN SODIUM 40 MG/0.4ML IJ SOSY
40.0000 mg | PREFILLED_SYRINGE | INTRAMUSCULAR | Status: DC
  Administered 2024-05-23: 40 mg via SUBCUTANEOUS
  Filled 2024-05-23: qty 0.4

## 2024-05-23 MED ORDER — DIATRIZOATE MEGLUMINE & SODIUM 66-10 % PO SOLN
90.0000 mL | Freq: Once | ORAL | Status: AC
  Administered 2024-05-23: 90 mL via ORAL
  Filled 2024-05-23: qty 90

## 2024-05-23 MED ORDER — ADULT MULTIVITAMIN W/MINERALS CH
1.0000 | ORAL_TABLET | Freq: Every day | ORAL | Status: DC
  Filled 2024-05-23: qty 1

## 2024-05-23 NOTE — Consult Note (Signed)
 "       Consult Note  [name] November 02, 1975  [id number].    Requesting MD:  Dr. 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 Patient has had chemotherapy and has been followed by Dr. Lanny.   Pt presented to the ED due to right sided abdominal pain that began yesterday evening and worsened in severity. Pain was not helped by medications at home (tramadol ) and patient elected to come to the ED. She has since had improvement in her pain with medications in the ED. She currently rates her pain 3/10. She reports minimal nausea that was helped with medications as well. Denies vomiting. Her last BM was this AM. She also had BM yesterday. She reports flatulence. Her last PO intake with yesterday evening. Patient denies fevers, SOB, and CP.  Of note, patient states that she continued plans to follow with [facility] for second surgical opinion in the coming days.  In the ED work initially with tachycardia and hypertension. This has since improved. CT imaging completed showing approximately 8 cm segment of mid transverse colon adjacent to the known gallbladder fossa mass with irregular walls and decreased caliber/completely collapsed lumen, concerning for possible infiltration/stricture. The cecum is approximately filled with stool, with likely developing obstruction. Interval development of intrahepatic biliary ductal dilatation measuring up to 6 mm. Complex-appearing centrally hypodense/necrotic irregular right upper quadrant  mass measuring 5 x 4.3 cm with peripheral enhancement in the expected region of the gallbladder, abutting the transverse colon, with adjacent pericholecystic irregular hepatic hypodensity measuring up to 2.1 cm. Findings consistent with malignancy with extension into liver.  She has since received gastrografin  to rule out bowel obstruction.  General surgery has been consulted.  Surgical history: Open partial hepatectomy 04/08/2024; Laparoscopic cholecystectomy 04/08/2024; Cesarean section Anticoagulation history: Denies Colonoscopy: Has never had completed Tobacco use: Denies Alcohol use: Denies Illicit drug use: Denies Allergies: NKDA; Shellfish   ROS: Per HPI  Family History  Problem Relation Age of Onset   Breast cancer Mother 34   Hyperlipidemia Brother        Big Brother   Hyperlipidemia Brother        Little Brother   Hypertension Brother        also blocked artery ? opened up.   Breast cancer Maternal Grandmother     Past Medical History:  Diagnosis Date   Allergy    Cancer [facility])     Past Surgical History:  Procedure Laterality Date   CESAREAN SECTION     CHOLECYSTECTOMY N/A 04/08/2024   Procedure: LAPAROSCOPIC CHOLECYSTECTOMY;  Surgeon: Dasie Leonor CROME, MD;  Location: [facility] OR;  Service: General;  Laterality: N/A;   IR IMAGING GUIDED PORT INSERTION  04/27/2024   MOLE REMOVAL     OPEN PARTIAL HEPATECTOMY  N/A 04/08/2024   Procedure: HEPATECTOMY, PARTIAL, OPEN;  Surgeon: Dasie Leonor CROME, MD;  Location: [facility] OR;  Service: General;  Laterality: N/A;    Social History:  reports that she has never smoked. She has never used smokeless tobacco. She reports that she does not currently use alcohol. She reports that she  does not use drugs.  Allergies: Allergies[1]  (Not in a hospital admission)   Blood pressure 125/87, pulse 79, temperature 98.6 F (37 C), resp. rate 16, height 5' 9 (1.753 m), weight 56 kg, SpO2 98%. Physical Exam:  General: Pleasant female who is  laying in bed in NAD. HEENT: Head is normocephalic, atraumatic. Sclera are noninjected. EOMI. Ears and nose without any masses or lesions. Wearing a surgical mask Heart: HR normal during encounter.  Lungs: Respiratory effort nonlabored on room air.  Abd: Soft, ND. Mild tenderness to palpation of right abdomen/flank. No rebound tenderness or guarding. Laparoscopic incision scars present. MS: Able to move all 4 extremities.  Skin: Warm and dry.  Psych: A&Ox3 with an appropriate affect.   Results for orders placed or performed during the hospital encounter of 05/22/24 (from the past 48 hours)  Comprehensive metabolic panel     Status: Abnormal   Collection Time: 05/22/24 11:09 PM  Result Value Ref Range   Sodium 137 135 - 145 mmol/L   Potassium 3.7 3.5 - 5.1 mmol/L   Chloride 100 98 - 111 mmol/L   CO2 24 22 - 32 mmol/L   Glucose, Bld 133 (H) 70 - 99 mg/dL    Comment: Glucose reference range applies only to samples taken after fasting for at least 8 hours.   BUN 10 6 - 20 mg/dL   Creatinine, Ser 9.20 0.44 - 1.00 mg/dL   Calcium 9.7 8.9 - 89.6 mg/dL   Total Protein 7.9 6.5 - 8.1 g/dL   Albumin 4.3 3.5 - 5.0 g/dL   AST 27 15 - 41 U/L   ALT 32 0 - 44 U/L   Alkaline Phosphatase 110 38 - 126 U/L   Total Bilirubin 0.3 0.0 - 1.2 mg/dL   GFR, Estimated >39 >39 mL/min    Comment: (NOTE) Calculated using the CKD-EPI Creatinine Equation (2021)    Anion gap 13 5 - 15    Comment: Performed at [facility], [address] [place], [place] [address]  Lipase, blood     Status: None   Collection Time: 05/22/24 11:09 PM  Result Value Ref Range   Lipase 18 11 - 51 U/L    Comment: Performed at [facility], [address] [place], [place] [address]  CBC with Diff     Status: Abnormal   Collection Time: 05/22/24 11:09 PM  Result Value Ref Range   WBC 5.9 4.0 - 10.5 K/uL   RBC 4.01 3.87 - 5.11 MIL/uL   Hemoglobin 12.2 12.0 - 15.0 g/dL   HCT 64.1 (L) 63.9 -  46.0 %   MCV 89.3 80.0 - 100.0 fL   MCH 30.4 26.0 - 34.0 pg   MCHC 34.1 30.0 - 36.0 g/dL   RDW 86.5 88.4 - 84.4 %   Platelets 238 150 - 400 K/uL   nRBC 0.0 0.0 - 0.2 %   Neutrophils Relative % 63 %   Neutro Abs 3.7 1.7 - 7.7 K/uL   Lymphocytes Relative 27 %   Lymphs Abs 1.6 0.7 - 4.0 K/uL   Monocytes Relative 9 %   Monocytes Absolute 0.5 0.1 - 1.0 K/uL   Eosinophils Relative 1 %   Eosinophils Absolute 0.0 0.0 - 0.5 K/uL   Basophils Relative 0 %   Basophils Absolute 0.0 0.0 - 0.1 K/uL   Immature Granulocytes 0 %   Abs Immature Granulocytes 0.02 0.00 - 0.07 K/uL    Comment: Performed at [facility],  [address], [place], [place] [address]  hCG, serum, qualitative     Status: None   Collection Time: 05/22/24 11:09 PM  Result Value Ref Range   Preg, Serum NEGATIVE NEGATIVE    Comment:        THE SENSITIVITY OF THIS METHODOLOGY IS >10 mIU/mL. Performed at [facility], [address], [place], [place] [address]   Urinalysis, Routine w reflex microscopic -Urine, Clean Catch     Status: Abnormal   Collection Time: 05/22/24 11:41 PM  Result Value Ref Range   Color, Urine YELLOW YELLOW   APPearance HAZY (A) CLEAR   Specific Gravity, Urine 1.020 1.005 - 1.030   pH 5.0 5.0 - 8.0   Glucose, UA NEGATIVE NEGATIVE mg/dL   Hgb urine dipstick NEGATIVE NEGATIVE   Bilirubin Urine NEGATIVE NEGATIVE   Ketones, ur NEGATIVE NEGATIVE mg/dL   Protein, ur NEGATIVE NEGATIVE mg/dL   Nitrite NEGATIVE NEGATIVE   Leukocytes,Ua NEGATIVE NEGATIVE    Comment: Performed at [facility], [address], [place], [place] [address]  CBC     Status: Abnormal   Collection Time: 05/23/24  4:11 AM  Result Value Ref Range   WBC 5.7 4.0 - 10.5 K/uL   RBC 3.95 3.87 - 5.11 MIL/uL   Hemoglobin 11.7 (L) 12.0 - 15.0 g/dL   HCT 63.4 63.9 - 53.9 %   MCV 92.4 80.0 - 100.0 fL   MCH 29.6 26.0 - 34.0 pg   MCHC 32.1 30.0 - 36.0 g/dL   RDW 86.4 88.4 - 84.4 %    Platelets 245 150 - 400 K/uL   nRBC 0.0 0.0 - 0.2 %    Comment: Performed at [facility], [address] [place], [place] [address]  Basic metabolic panel     Status: None   Collection Time: 05/23/24  4:11 AM  Result Value Ref Range   Sodium 139 135 - 145 mmol/L   Potassium 4.5 3.5 - 5.1 mmol/L    Comment: HEMOLYSIS AT THIS LEVEL MAY AFFECT RESULT   Chloride 101 98 - 111 mmol/L   CO2 28 22 - 32 mmol/L   Glucose, Bld 96 70 - 99 mg/dL    Comment: Glucose reference range applies only to samples taken after fasting for at least 8 hours.   BUN 9 6 - 20 mg/dL   Creatinine, Ser 9.22 0.44 - 1.00 mg/dL   Calcium 9.8 8.9 - 89.6 mg/dL   GFR, Estimated >39 >39 mL/min    Comment: (NOTE) Calculated using the CKD-EPI Creatinine Equation (2021)    Anion gap 10 5 - 15    Comment: Performed at [facility], [address], [place], [place] [address]  Magnesium      Status: None   Collection Time: 05/23/24  4:11 AM  Result Value Ref Range   Magnesium  2.3 1.7 - 2.4 mg/dL    Comment: Performed at [facility], [address] [place], [place] [address]  Phosphorus     Status: None   Collection Time: 05/23/24  4:11 AM  Result Value Ref Range   Phosphorus 4.5 2.5 - 4.6 mg/dL    Comment: Performed at [facility], [address], [place], [place] [address]   CT ABDOMEN PELVIS W CONTRAST Result Date: 05/23/2024 EXAM: CT ABDOMEN AND PELVIS WITH CONTRAST 05/23/2024 12:32:49 AM TECHNIQUE: CT of the abdomen and pelvis was performed with the administration of 100 mL of iohexol  (OMNIPAQUE ) 300 MG/ML  solution. Multiplanar reformatted images are provided for review. Automated exposure control, iterative reconstruction, and/or weight-based adjustment of the mA/kV was utilized to reduce the radiation dose to as low as reasonably achievable. COMPARISON: PET CT 04/20/2024, CT abdomen and pelvis 03/29/2024 CLINICAL HISTORY: Abdominal pain,  acute, nonlocalized. FINDINGS: LOWER CHEST: No acute abnormality. LIVER: Pericholecystic irregular hypodensity of the hepatic lobe measuring up to 2.1 cm (2.27). Interval development of intrahepatic biliary ductal dilatation measuring up to 6 mm. GALLBLADDER AND BILE DUCTS: Complex-appearing centrally hypodense/necrotic irregular right upper quadrant mass measuring 5 x 4.3 cm with peripheral enhancement. Finding in the expected region of the gallbladder. The finding abuts the transverse colon (2:43; 7:38). Interval development of intrahepatic biliary ductal dilatation measuring up to 6 mm. SPLEEN: No acute abnormality. PANCREAS: No acute abnormality. ADRENAL GLANDS: No acute abnormality. KIDNEYS, URETERS AND BLADDER: No stones in the kidneys or ureters. No hydronephrosis. No perinephric or periureteral stranding. Urinary bladder is unremarkable. GI AND BOWEL: Approximately 8 cm segment of mid transverse colon adjacent to this mass appears with irregular walls and decreased caliber/completely collapsed lumen limiting its evaluation and concerning for possible infiltration/stricture at this level. Stool throughout the cecum. No small bowel thickening or dilatation. The appendix is not definitely identified with no inflammatory changes in the right lower quadrant to suggest acute appendicitis. No pneumatosis. Stomach demonstrates no acute abnormality. There is no bowel obstruction. PERITONEUM AND RETROPERITONEUM: No ascites. No free air. VASCULATURE: Aorta is normal in caliber. LYMPH NODES: No lymphadenopathy. REPRODUCTIVE ORGANS: The uterus is unremarkable. No adnexal mass. BONES AND SOFT TISSUES: No acute osseous abnormality. No focal soft tissue abnormality. IMPRESSION: 1. Approximately 8 cm segment of mid transverse colon adjacent to the known gallbladder fossa mass with irregular walls and decreased caliber/completely collapsed lumen, concerning for possible infiltration/stricture. The cecum is approximately  filled with stool, with likely developing obstruction. 2. Interval development of intrahepatic biliary ductal dilatation measuring up to 6 mm. 3. Complex-appearing centrally hypodense/necrotic irregular right upper quadrant mass measuring 5 x 4.3 cm with peripheral enhancement in the expected region of the gallbladder, abutting the transverse colon, with adjacent pericholecystic irregular hepatic hypodensity measuring up to 2.1 cm. Findings consistent with malignancy with extension into liver. Electronically signed by: Morgane Naveau MD 05/23/2024 12:55 AM EST RP Workstation: [id number]      Assessment/Plan -49 year old female with right abdominal pain with concerns of bowel obstruction -Hx of adenocarcinoma with a metastatic liver lesion, and intraoperative findings showed involvement of the duodenum and transverse colon -S/P diagnostic laparoscopy, partial fenestrating cholecystectomy and liver biopsy   -CT showing approximately 8 cm segment of mid transverse colon adjacent to the known gallbladder fossa mass with irregular walls and decreased caliber/completely collapsed lumen, concerning for possible infiltration/stricture. The cecum is approximately filled with stool, with likely developing obstruction. Interval development of intrahepatic biliary ductal dilatation measuring up to 6 mm. Complex-appearing centrally hypodense/necrotic irregular right upper quadrant mass measuring 5 x 4.3 cm with peripheral enhancement in the expected region of the gallbladder, abutting the transverse colon, with adjacent pericholecystic irregular hepatic hypodensity measuring up to 2.1 cm. Findings consistent with malignancy with extension into liver. -Xray pending this AM but contrast noted throughout colon and to the level of the rectum as patient recent gastrografin . -Afebrile. -WBC 5.7 and HGB 11.7 -CMP WNL -Exam with mild tenderness to right abdomen that has improved with medications since arrival. -Having  bowel function and flatulence. Denies n/v currently. -Okay for clear liquids at this time. -No emergent  surgical intervention needed. -Patient to continue with follow up through [facility] as planned for second surgical opinion.  -Will continue to follow.  Admit to medicine  FEN: Okay for clear liquid diet; IVF per primary team VTE: Lovenox  ID: None  I reviewed EDP notes, specialist notes, nursing notes, hospitalist notes, last 24 h vitals and pain scores, last 48 h intake and output, last 24 h labs and trends, and last 24 h imaging results.  This care required high  level of medical decision making.   Marjorie Carlyon Favre, [facility] Surgery 05/23/2024, 10:25 AM Please see Amion for pager number during day hours 7:00am-4:30pm      [1]  Allergies Allergen Reactions   Shellfish Allergy Other (See Comments)    Throat gets a little itchy   "

## 2024-05-23 NOTE — H&P (Addendum)
 " History and Physical  [name] FMW:[id number] DOB: August 20, 1975 DOA: 05/22/2024  Referring physician: Vicky Charleston, PA-EDP  PCP: Charlett Apolinar POUR, MD  Outpatient Specialists: Medical oncology. Patient coming from:   Chief Complaint: Right-sided abdominal pain.  HPI: [name] is a 49 y.o. 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 Lanny, who presents to the ER with complaints of worsening chronic right-sided abdominal pain.  States she always has the pain there and is about a 1 or 2 out of 10 at baseline.  Developed sudden onset of worsening pain at the right sided of her abdomen.  She was told by her provider that if she has pain that is not controlled by home pain medication, tramadol , to come to the ER for evaluation.  Her pain was severe 9 out of 10.  She took tramadol  without relief.  Also endorses nausea without vomiting.  Took a dose of home Zofran .  Last bowel movement was this morning.  Has positive flatulence.  In the ER, afebrile, initially tachycardic and significantly hypertensive, due to uncontrolled pain.  Her pain improved after 1 dose of IV Dilaudid  0.5 mg.  Nausea also improved with 4 mg of IV Zofran .  A CT scan abdomen pelvis with contrast revealed approximately 8 cm segment of mid transverse colon adjacent to known gallbladder fossa mass with irregular walls and decreased caliber, completely collapsed lumen, concerning for possible infiltration/stricture.  The cecum is approximately filled with stool, likely developing obstruction.  Complex appearing centrally hypodense necrotic irregular right upper quadrant mass measuring 5 x 4.3 cm with peripheral enhancement in  the expected region of the gallbladder with adjacent pericholecystic irregular hepatic hypodensity measuring up to 2.1 cm.  Finding consistent with malignancy with extension into liver.  EDP discussed the case with general surgery who will see in consultation.  Currently no surgical plans.  Admitted by [facility], hospitalist service.  At the time of this visit, the patient is in the ER room accompanied by her husband.  She has minimal right sided abdominal pain 1-2 out of 10.  Nausea is controlled.  No abdominal distention on exam.  Bowel sounds present.  P.o. Gastrografin  90 cc x 1 ordered to rule out bowel obstruction.  Abdominal x-ray scheduled for 10 AM.  ED Course: Temperature 98.5.  BP 140/110, pulse 128, respiratory 18, O2 saturation 100% on room air.  After pain control BP 134/86, pulse 89.  Review of Systems: Review of systems as noted in the HPI. All other systems reviewed and are negative.   Past Medical History:  Diagnosis Date   Allergy    Cancer [facility])    Past Surgical History:  Procedure Laterality Date   CESAREAN SECTION     CHOLECYSTECTOMY N/A 04/08/2024   Procedure: LAPAROSCOPIC CHOLECYSTECTOMY;  Surgeon: Dasie Leonor CROME, MD;  Location: [facility] OR;  Service: General;  Laterality: N/A;   IR IMAGING GUIDED PORT INSERTION  04/27/2024   MOLE REMOVAL     OPEN PARTIAL HEPATECTOMY  N/A 04/08/2024   Procedure: HEPATECTOMY, PARTIAL, OPEN;  Surgeon: Dasie Leonor CROME, MD;  Location: [facility] OR;  Service: General;  Laterality: N/A;    Social History:  reports that she has never smoked. She has never used smokeless tobacco. She reports that she does not currently use  alcohol. She reports that she does not use drugs.   Allergies[1]  Family History  Problem Relation Age of Onset   Breast cancer Mother 93   Hyperlipidemia Brother        Big Brother   Hyperlipidemia Brother        Little Brother   Hypertension Brother        also blocked artery ? opened up.   Breast cancer Maternal  Grandmother       Prior to Admission medications  Medication Sig Start Date End Date Taking? Authorizing Provider  acetaminophen  (TYLENOL ) 500 MG tablet Take 500-1,000 mg by mouth every 6 (six) hours as needed (pain.).    [provider]  folic acid (FOLVITE) 1 MG tablet Take 1 mg by mouth at bedtime. 11/26/17   [provider]  ibuprofen (ADVIL) 200 MG tablet Take 200-400 mg by mouth every 8 (eight) hours as needed (pain.).    [provider]  JUNEL FE 1/20 1-20 MG-MCG tablet Take 1 tablet by mouth at bedtime. 10/09/11   [provider]  lidocaine -prilocaine  (EMLA ) cream Apply to affected area once 04/15/24   Lanny Callander, MD  Multiple Vitamin (MULTIVITAMIN WITH MINERALS) TABS tablet Take 1 tablet by mouth in the morning.    [provider]  ondansetron  (ZOFRAN ) 8 MG tablet Take 1 tablet (8 mg total) by mouth every 8 (eight) hours as needed for nausea or vomiting. Start on the third day after cisplatin . 04/15/24   Lanny Callander, MD  prochlorperazine  (COMPAZINE ) 10 MG tablet Take 1 tablet (10 mg total) by mouth every 6 (six) hours as needed (Nausea or vomiting). 04/15/24   Lanny Callander, MD  traMADol  (ULTRAM ) 50 MG tablet Take 1 tablet (50 mg total) by mouth every 12 (twelve) hours as needed. 05/12/24   Lanny Callander, MD    Physical Exam: BP (!) 139/91   Pulse 98   Temp 98.5 F (36.9 C)   Resp 16   Ht 5' 9 (1.753 m)   Wt 56 kg   SpO2 100%   BMI 18.23 kg/m   General: 49 y.o. year-old female well developed well nourished in no acute distress.  Alert and oriented x3. Cardiovascular: Regular rate and rhythm with no rubs or gallops.  No thyromegaly or JVD noted.  No lower extremity edema. 2/4 pulses in all 4 extremities. Respiratory: Clear to auscultation with no wheezes or rales. Good inspiratory effort. Abdomen: Soft with very mild right-sided tenderness with moderate palpation.  Nondistended with normal bowel sounds x4 quadrants. Muskuloskeletal: No cyanosis,  clubbing or edema noted bilaterally Neuro: CN II-XII intact, strength, sensation, reflexes Skin: No ulcerative lesions noted or rashes Psychiatry: Judgement and insight appear normal. Mood is appropriate for condition and setting          Labs on Admission:  Basic Metabolic Panel: Recent Labs  Lab 05/22/24 2309  NA 137  K 3.7  CL 100  CO2 24  GLUCOSE 133*  BUN 10  CREATININE 0.79  CALCIUM 9.7   Liver Function Tests: Recent Labs  Lab 05/22/24 2309  AST 27  ALT 32  ALKPHOS 110  BILITOT 0.3  PROT 7.9  ALBUMIN 4.3   Recent Labs  Lab 05/22/24 2309  LIPASE 18   No results for input(s): AMMONIA in the last 168 hours. CBC: Recent Labs  Lab 05/22/24 2309  WBC 5.9  NEUTROABS 3.7  HGB 12.2  HCT 35.8*  MCV 89.3  PLT 238   Cardiac Enzymes: No results  for input(s): CKTOTAL, CKMB, CKMBINDEX, TROPONINI in the last 168 hours.  BNP (last 3 results) No results for input(s): BNP in the last 8760 hours.  ProBNP (last 3 results) No results for input(s): PROBNP in the last 8760 hours.  CBG: No results for input(s): GLUCAP in the last 168 hours.  Radiological Exams on Admission: CT ABDOMEN PELVIS W CONTRAST Result Date: 05/23/2024 EXAM: CT ABDOMEN AND PELVIS WITH CONTRAST 05/23/2024 12:32:49 AM TECHNIQUE: CT of the abdomen and pelvis was performed with the administration of 100 mL of iohexol  (OMNIPAQUE ) 300 MG/ML solution. Multiplanar reformatted images are provided for review. Automated exposure control, iterative reconstruction, and/or weight-based adjustment of the mA/kV was utilized to reduce the radiation dose to as low as reasonably achievable. COMPARISON: PET CT 04/20/2024, CT abdomen and pelvis 03/29/2024 CLINICAL HISTORY: Abdominal pain, acute, nonlocalized. FINDINGS: LOWER CHEST: No acute abnormality. LIVER: Pericholecystic irregular hypodensity of the hepatic lobe measuring up to 2.1 cm (2.27). Interval development of intrahepatic biliary ductal  dilatation measuring up to 6 mm. GALLBLADDER AND BILE DUCTS: Complex-appearing centrally hypodense/necrotic irregular right upper quadrant mass measuring 5 x 4.3 cm with peripheral enhancement. Finding in the expected region of the gallbladder. The finding abuts the transverse colon (2:43; 7:38). Interval development of intrahepatic biliary ductal dilatation measuring up to 6 mm. SPLEEN: No acute abnormality. PANCREAS: No acute abnormality. ADRENAL GLANDS: No acute abnormality. KIDNEYS, URETERS AND BLADDER: No stones in the kidneys or ureters. No hydronephrosis. No perinephric or periureteral stranding. Urinary bladder is unremarkable. GI AND BOWEL: Approximately 8 cm segment of mid transverse colon adjacent to this mass appears with irregular walls and decreased caliber/completely collapsed lumen limiting its evaluation and concerning for possible infiltration/stricture at this level. Stool throughout the cecum. No small bowel thickening or dilatation. The appendix is not definitely identified with no inflammatory changes in the right lower quadrant to suggest acute appendicitis. No pneumatosis. Stomach demonstrates no acute abnormality. There is no bowel obstruction. PERITONEUM AND RETROPERITONEUM: No ascites. No free air. VASCULATURE: Aorta is normal in caliber. LYMPH NODES: No lymphadenopathy. REPRODUCTIVE ORGANS: The uterus is unremarkable. No adnexal mass. BONES AND SOFT TISSUES: No acute osseous abnormality. No focal soft tissue abnormality. IMPRESSION: 1. Approximately 8 cm segment of mid transverse colon adjacent to the known gallbladder fossa mass with irregular walls and decreased caliber/completely collapsed lumen, concerning for possible infiltration/stricture. The cecum is approximately filled with stool, with likely developing obstruction. 2. Interval development of intrahepatic biliary ductal dilatation measuring up to 6 mm. 3. Complex-appearing centrally hypodense/necrotic irregular right upper  quadrant mass measuring 5 x 4.3 cm with peripheral enhancement in the expected region of the gallbladder, abutting the transverse colon, with adjacent pericholecystic irregular hepatic hypodensity measuring up to 2.1 cm. Findings consistent with malignancy with extension into liver. Electronically signed by: Morgane Naveau MD 05/23/2024 12:55 AM EST RP Workstation: [id number]    EKG: I independently viewed the EKG done and my findings are as followed: None at the time of visit.  Assessment/Plan Present on Admission:  Intractable abdominal pain  Principal Problem:   Intractable abdominal pain  Acute intractable abdominal pain, suspect cancer related pain. Rule out bowel obstruction CT scan abdomen pelvis with contrast with findings as stated above. Follow abdominal x-ray post p.o. Gastrografin  administration. Continue pain control and bowel regimen. IV fluid hydration Replete electrolytes as indicated Optimize magnesium  and potassium levels. Early mobilization.  Stage IV gallbladder cancer with liver metastasis Diagnosed in December 2025, underwent laparoscopic partial cholecystectomy and liver biopsy on April 08, 2024 with postop drain placement which has been removed, started chemotherapy, cisplatin , gemcitabine , Durvalumab  on 05/05/2023, followed by Dr. Lanny, medical oncologist. Close follow-up with medical oncology as outpatient.  Hyperglycemia, mild Serum glucose 133 Monitor for now  Nausea without vomiting Continue as needed antiemetics. Encourage oral intake as tolerated.   Time: 75 minutes.   DVT prophylaxis: Subcu Lovenox  daily.  Code Status: Full code.  Family Communication: The patient's husband at bedside.  Disposition Plan: Admitted to MedSurg unit.  Consults called: EDP consulted general surgery.  Admission status: Observation status.   Status is: Observation    Terry LOISE Hurst MD [facility] Pager [phone]  If 7PM-7AM, please contact  night-coverage [email or web address] Password [email or web address]  05/23/2024, 2:02 AM      [1]  Allergies Allergen Reactions   Shellfish Allergy Other (See Comments)    Throat gets a little itchy   "

## 2024-05-23 NOTE — Progress Notes (Signed)
 Contacted pt regarding after hours call. Pt ended up going to the ED for pain control. Pt had just gotten home and stated pain was better. Pt has an appointment with Dr Lanny in am. Pt stated she was currently doing okay.

## 2024-05-23 NOTE — Discharge Summary (Signed)
 " Physician Discharge Summary   Patient: [name] MRN: [id number] DOB: 04-29-1975  Admit date:     05/22/2024  Discharge date: 05/23/24  Discharge Physician: MDALA-GAUSI, GOLDEN PILLOW   PCP: Charlett Apolinar POUR, MD   Recommendations at discharge:   Follow-up with oncology  Discharge Diagnoses: Principal Problem:   Intractable abdominal pain  Resolved Problems:   * No resolved hospital problems. 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 Patient has had chemotherapy and has been followed by Dr. Lanny.    Pt presented to the ED due to right sided abdominal pain that began yesterday evening and worsened in severity. Pain was not helped by medications at home (tramadol ) and patient elected to come to the ED.   The hospital course is in problem-based format below:    Assessment and Plan:  Right-sided abdominal pain CT showed approximately 8 cm segment of mid transverse colon adjacent to the known gallbladder fossa mass with irregular walls and decreased caliber/completely collapsed lumen, concerning for possible infiltration/stricture. The cecum is approximately filled with stool, with likely developing obstruction. Interval development of intrahepatic biliary ductal dilatation measuring up to 6 mm. Complex-appearing centrally hypodense/necrotic irregular right upper quadrant mass measuring 5 x 4.3 cm with peripheral enhancement in the expected region of the gallbladder, abutting the transverse colon, with adjacent pericholecystic irregular hepatic hypodensity measuring up to 2.1 cm. Findings consistent with malignancy with extension into liver. General surgery was consulted.  Patient reported some relief in her pain with  IV Dilaudid  and then oxycodone . Patient received Gastrografin  to rule out bowel obstruction.  KUB showed radiopaque enteric contrast throughout the colon to the level of the rectum. As obstruction had been ruled out, she was started on a full liquid diet with advice to advance as tolerated. She was given a prescription for oxycodone  at discharge and advised to discontinue the tramadol .  Metastatic adenocarcinoma of gallbladder Follows up with oncology CT findings were as above. Patient has an appointment on 1/27.       Consultants: General Surgery Procedures performed: n/a  Disposition: Home Diet recommendation:  Discharge Diet Orders (From admission, onward)     Start     Ordered   05/23/24 0000  Diet full liquid       Comments: Advance as tolerated.   05/23/24 1353           Full liquid diet and advance as tolerated  DISCHARGE MEDICATION: Allergies as of 05/23/2024       Reactions   Shellfish Allergy Other (See Comments)   Throat gets a little itchy        Medication List     STOP taking these medications    ibuprofen 200 MG tablet Commonly known as: ADVIL   Junel FE 1/20 1-20 MG-MCG tablet Generic drug: norethindrone-ethinyl estradiol-FE   traMADol  50 MG tablet Commonly known as: ULTRAM        TAKE these medications    acetaminophen  500 MG tablet Commonly known as: TYLENOL  Take 500-1,000 mg by mouth every 6 (six) hours as needed (pain.).   docusate sodium  100 MG capsule Commonly known as: COLACE Take 100 mg by mouth daily as needed for mild constipation.   folic acid 1 MG tablet Commonly known as: FOLVITE Take 1 mg  by mouth at bedtime.   lidocaine -prilocaine  cream Commonly known as: EMLA  Apply to affected area once   loratadine 10 MG tablet Commonly known as: CLARITIN Take 10 mg by mouth daily.   Melatonin 10 MG Caps Take 10 mg by mouth at bedtime as needed (Sleep).   multivitamin with minerals Tabs tablet Take 1 tablet by mouth  in the morning.   ondansetron  8 MG tablet Commonly known as: Zofran  Take 1 tablet (8 mg total) by mouth every 8 (eight) hours as needed for nausea or vomiting. Start on the third day after cisplatin .   oxyCODONE  5 MG immediate release tablet Commonly known as: Oxy IR/ROXICODONE  Take 1-2 tablets (5-10 mg total) by mouth every 4 (four) hours as needed for moderate pain (pain score 4-6) or breakthrough pain.   prochlorperazine  10 MG tablet Commonly known as: COMPAZINE  Take 1 tablet (10 mg total) by mouth every 6 (six) hours as needed (Nausea or vomiting).        Discharge Exam: Filed Weights   05/22/24 2241  Weight: 56 kg   Physical Exam on Day of Discharge   General: Alert, cheerful, oriented X3  Oral cavity: moist mucous membranes  Neck: supple  Chest: clear to auscultation. No crackles, no wheezes  CVS: S1,S2 RRR. No murmurs  Abd: No distention, soft, non-tender. No masses palpable  Extr: No edema    Condition at discharge: stable  The results of significant diagnostics from this hospitalization (including imaging, microbiology, ancillary and laboratory) are listed below for reference.   Imaging Studies: DG Abd Portable 1V Result Date: 05/23/2024 CLINICAL DATA:  Small-bowel obstruction EXAM: PORTABLE ABDOMEN - 1 VIEW COMPARISON:  Earlier same day CT abdomen and pelvis FINDINGS: Radiopaque enteric contrast material is seen throughout the colon to the level of the rectum. Dilute contrast material within the urinary bladder. Nonobstructive bowel gas pattern. No free air or pneumatosis. No acute or substantial osseous abnormality. The sacrum and coccyx are partially obscured by overlying bowel contents. Partially imaged lung bases are clear. IMPRESSION: Nonobstructive bowel gas pattern. Radiopaque enteric contrast material is seen throughout the colon to the level of the rectum. Electronically Signed   By: Limin  Xu M.D.   On: 05/23/2024 10:38   CT ABDOMEN PELVIS W  CONTRAST Result Date: 05/23/2024 EXAM: CT ABDOMEN AND PELVIS WITH CONTRAST 05/23/2024 12:32:49 AM TECHNIQUE: CT of the abdomen and pelvis was performed with the administration of 100 mL of iohexol  (OMNIPAQUE ) 300 MG/ML solution. Multiplanar reformatted images are provided for review. Automated exposure control, iterative reconstruction, and/or weight-based adjustment of the mA/kV was utilized to reduce the radiation dose to as low as reasonably achievable. COMPARISON: PET CT 04/20/2024, CT abdomen and pelvis 03/29/2024 CLINICAL HISTORY: Abdominal pain, acute, nonlocalized. FINDINGS: LOWER CHEST: No acute abnormality. LIVER: Pericholecystic irregular hypodensity of the hepatic lobe measuring up to 2.1 cm (2.27). Interval development of intrahepatic biliary ductal dilatation measuring up to 6 mm. GALLBLADDER AND BILE DUCTS: Complex-appearing centrally hypodense/necrotic irregular right upper quadrant mass measuring 5 x 4.3 cm with peripheral enhancement. Finding in the expected region of the gallbladder. The finding abuts the transverse colon (2:43; 7:38). Interval development of intrahepatic biliary ductal dilatation measuring up to 6 mm. SPLEEN: No acute abnormality. PANCREAS: No acute abnormality. ADRENAL GLANDS: No acute abnormality. KIDNEYS, URETERS AND BLADDER: No stones in the kidneys or ureters. No hydronephrosis. No perinephric or periureteral stranding. Urinary bladder is unremarkable. GI AND BOWEL: Approximately 8 cm segment of mid transverse colon adjacent to this mass appears  with irregular walls and decreased caliber/completely collapsed lumen limiting its evaluation and concerning for possible infiltration/stricture at this level. Stool throughout the cecum. No small bowel thickening or dilatation. The appendix is not definitely identified with no inflammatory changes in the right lower quadrant to suggest acute appendicitis. No pneumatosis. Stomach demonstrates no acute abnormality. There is no bowel  obstruction. PERITONEUM AND RETROPERITONEUM: No ascites. No free air. VASCULATURE: Aorta is normal in caliber. LYMPH NODES: No lymphadenopathy. REPRODUCTIVE ORGANS: The uterus is unremarkable. No adnexal mass. BONES AND SOFT TISSUES: No acute osseous abnormality. No focal soft tissue abnormality. IMPRESSION: 1. Approximately 8 cm segment of mid transverse colon adjacent to the known gallbladder fossa mass with irregular walls and decreased caliber/completely collapsed lumen, concerning for possible infiltration/stricture. The cecum is approximately filled with stool, with likely developing obstruction. 2. Interval development of intrahepatic biliary ductal dilatation measuring up to 6 mm. 3. Complex-appearing centrally hypodense/necrotic irregular right upper quadrant mass measuring 5 x 4.3 cm with peripheral enhancement in the expected region of the gallbladder, abutting the transverse colon, with adjacent pericholecystic irregular hepatic hypodensity measuring up to 2.1 cm. Findings consistent with malignancy with extension into liver. Electronically signed by: Morgane Naveau MD 05/23/2024 12:55 AM EST RP Workstation: [id number]   IR IMAGING GUIDED PORT INSERTION Result Date: 04/29/2024 EXAM: Implanted venous access port placement COMPARISON: None. CLINICAL HISTORY: for chemotherapy Additional clinical history: Gallbladder carcinoma. Complications: No immediate complications. Conscious Sedation: 3 mg Versed  IV, 150 mcg fentanyl  IV. Sedation Technique: Under physician supervision, Versed  and fentanyl  were administered IV for moderate sedation. Pulse oximetry, heart rate, and blood pressure were continuously monitored with an independent trained observer present. Physician face-to-face sedation time: 21 minutes. Discussion: Time out performed including verification of patient, date of birth, procedure, and access site as appropriate. Informed written consent was obtained. The access site and chest wall were  prepared and draped using maximal sterile barrier technique including cutaneous antisepsis. The patient was positioned supine. Initial imaging was performed. Venous access: Access site: Right internal jugular Laterality: Right Local anesthesia was administered. Under ultrasound guidance, venous access was obtained. A guidewire was advanced and a subcutaneous pocket was created. An implanted venous access port was placed with the catheter tunneled to the venous entry site. Port details: Port type: Single lumen Catheter tip position confirmed with imaging, in the region of the cavoatrial junction. The port was accessed and aspirated easily and flushed with saline and heparinized saline. The pocket and access site were closed and a sterile dressing was applied. Post-procedure imaging: Immediate post-placement imaging: Fluoroscopy Post-procedure imaging findings: Stable port. No pneumothorax. Contrast: None. Estimated blood loss: Less than 10 mL. Radiation exposure index (as provided by the fluoroscopic device): 0.4 mGy air kerma. IMPRESSION: 1. Technically successful placement of a right IJ implanted venous access port. 2. No immediate complications. Electronically signed by: Katheleen Faes MD 04/29/2024 10:46 AM EST RP Workstation: [id number]    Microbiology: Results for orders placed or performed in visit on 03/28/24  Culture, Urine     Status: None   Collection Time: 03/28/24  1:56 PM   Specimen: Urine  Result Value Ref Range Status   MICRO NUMBER: [id number]  Final   SPECIMEN QUALITY: Adequate  Final   Sample Source URINE, CLEAN CATCH  Final   STATUS: FINAL  Final   Result: No Growth  Final    Labs: CBC: Recent Labs  Lab 05/22/24 2309 05/23/24 0411  WBC 5.9 5.7  NEUTROABS 3.7  --   HGB  12.2 11.7*  HCT 35.8* 36.5  MCV 89.3 92.4  PLT 238 245   Basic Metabolic Panel: Recent Labs  Lab 05/22/24 2309 05/23/24 0411  NA 137 139  K 3.7 4.5  CL 100 101  CO2 24 28  GLUCOSE 133* 96  BUN 10 9   CREATININE 0.79 0.77  CALCIUM 9.7 9.8  MG  --  2.3  PHOS  --  4.5   Liver Function Tests: Recent Labs  Lab 05/22/24 2309  AST 27  ALT 32  ALKPHOS 110  BILITOT 0.3  PROT 7.9  ALBUMIN 4.3   CBG: No results for input(s): GLUCAP in the last 168 hours.  Discharge time spent: greater than 30 minutes.  Signed: MDALA-GAUSI, Sharika Mosquera AGATHA, MD [facility] 05/23/2024 "

## 2024-05-24 ENCOUNTER — Inpatient Hospital Stay

## 2024-05-24 ENCOUNTER — Encounter: Payer: Self-pay | Admitting: Hematology

## 2024-05-24 ENCOUNTER — Inpatient Hospital Stay: Admitting: Hematology

## 2024-05-24 VITALS — BP 128/92 | HR 108 | Temp 97.7°F | Resp 16 | Ht 69.0 in | Wt 121.4 lb

## 2024-05-24 DIAGNOSIS — C23 Malignant neoplasm of gallbladder: Secondary | ICD-10-CM

## 2024-05-24 DIAGNOSIS — Z5112 Encounter for antineoplastic immunotherapy: Secondary | ICD-10-CM | POA: Diagnosis not present

## 2024-05-24 LAB — CMP (CANCER CENTER ONLY)
ALT: 59 U/L — ABNORMAL HIGH (ref 0–44)
AST: 49 U/L — ABNORMAL HIGH (ref 15–41)
Albumin: 4.4 g/dL (ref 3.5–5.0)
Alkaline Phosphatase: 133 U/L — ABNORMAL HIGH (ref 38–126)
Anion gap: 13 (ref 5–15)
BUN: 14 mg/dL (ref 6–20)
CO2: 27 mmol/L (ref 22–32)
Calcium: 9.3 mg/dL (ref 8.9–10.3)
Chloride: 97 mmol/L — ABNORMAL LOW (ref 98–111)
Creatinine: 0.75 mg/dL (ref 0.44–1.00)
GFR, Estimated: >60 mL/min
Glucose, Bld: 106 mg/dL — ABNORMAL HIGH (ref 70–99)
Potassium: 3.9 mmol/L (ref 3.5–5.1)
Sodium: 137 mmol/L (ref 135–145)
Total Bilirubin: 0.4 mg/dL (ref 0.0–1.2)
Total Protein: 8 g/dL (ref 6.5–8.1)

## 2024-05-24 LAB — CBC WITH DIFFERENTIAL (CANCER CENTER ONLY)
Abs Immature Granulocytes: 0.01 10*3/uL (ref 0.00–0.07)
Basophils Absolute: 0 10*3/uL (ref 0.0–0.1)
Basophils Relative: 0 %
Eosinophils Absolute: 0.1 10*3/uL (ref 0.0–0.5)
Eosinophils Relative: 2 %
HCT: 34.6 % — ABNORMAL LOW (ref 36.0–46.0)
Hemoglobin: 12 g/dL (ref 12.0–15.0)
Immature Granulocytes: 0 %
Lymphocytes Relative: 36 %
Lymphs Abs: 1.8 10*3/uL (ref 0.7–4.0)
MCH: 30.6 pg (ref 26.0–34.0)
MCHC: 34.7 g/dL (ref 30.0–36.0)
MCV: 88.3 fL (ref 80.0–100.0)
Monocytes Absolute: 0.5 10*3/uL (ref 0.1–1.0)
Monocytes Relative: 10 %
Neutro Abs: 2.7 10*3/uL (ref 1.7–7.7)
Neutrophils Relative %: 52 %
Platelet Count: 335 10*3/uL (ref 150–400)
RBC: 3.92 MIL/uL (ref 3.87–5.11)
RDW: 13.9 % (ref 11.5–15.5)
WBC Count: 5.1 10*3/uL (ref 4.0–10.5)
nRBC: 0 % (ref 0.0–0.2)

## 2024-05-24 LAB — MAGNESIUM: Magnesium: 2 mg/dL (ref 1.7–2.4)

## 2024-05-24 LAB — GENETIC SCREENING ORDER

## 2024-05-24 LAB — PREGNANCY, URINE: Preg Test, Ur: NEGATIVE

## 2024-05-24 LAB — MISC LABCORP TEST (SEND OUT)
LabCorp test name: 83935
Labcorp test code: 83935

## 2024-05-24 MED FILL — Fosaprepitant Dimeglumine For IV Infusion 150 MG (Base Eq): INTRAVENOUS | Qty: 5 | Status: AC

## 2024-05-24 NOTE — Progress Notes (Signed)
 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  - She presented with a right upper quadrant abdominal pain, CT scan showed an inflamed gallbladder, and a 1.2 cm hepatic lesion which was not seen on the subsequent MRI. -She underwent laparoscopic a partial cholecystectomy and liver biopsy on April 08, 2024 with postop drain placement.  Surgical path showed adenocarcinoma with liver met.  -she started chemo cisplatin , gemcitabine  and durvalumab  on 05/05/2023  Assessment & Plan Metastatic gallbladder cancer Metastatic gallbladder cancer with tumor mass in the gallbladder and possible infiltration into the adjacent colon on recent CT imaging. No current evidence of bowel obstruction or perforation. Mildly elevated liver enzymes, likely chemotherapy-related. She remains on active chemotherapy and is cleared for ongoing infusions. Tumor infiltration is causing partial colon compression, increasing risk for future obstruction or perforation. She continues to work remotely and manages daily activities. - Reviewed recent CT scan and discussed findings, including possible colon involvement and absence of current obstruction or perforation. - Coordinated with surgical oncology (Dr. Debora) for further evaluation of colon involvement; sent CT images for review. - Cleared for chemotherapy infusion scheduled for tomorrow. - Scheduled follow-up for February 4th to coincide with next infusion. - Provided dietary guidance to limit fiber intake and reduce stool burden due to partial colon compression; advised increased protein and soft,  low-residue foods, with limited fruits and vegetables except low-fiber options such as bananas. - Recommended multivitamin supplementation due to reduced fruit and vegetable intake. - Discussed symptom management clinic availability for urgent issues during weekdays.  Neoplasm-related pain Significant pain due to tumor infiltration and partial colon compression, with episodes requiring emergency department evaluation. Pain is managed with oxycodone  and acetaminophen ; tramadol  provides limited relief. No evidence of acute obstruction or perforation. Pain control remains suboptimal. She is cautious about opioid use but concerned about undertreatment. Constipation risk from opioids discussed. - Advised alternating oxycodone  and tramadol  every 4-6 hours as needed for pain control. - Recommended acetaminophen  for mild pain (pain scale =5), and prescription opioids for moderate to severe pain (pain scale >5). - Discussed constipation risk with opioid use and importance of monitoring bowel movements. - Deferred addition of non-narcotic adjuvant analgesics (e.g., gabapentin, duloxetine) pending reassessment of pain control over the next week. - Referred to palliative care nurse practitioner for pain and symptom management support. - Advised to call on Friday to reassess pain and consider escalation of pain regimen if needed.  Acute diarrhea Acute diarrhea developed following administration of oral contrast and likely laxatives during recent imaging in the emergency department. Diarrhea has improved and is now infrequent. She uses loperamide for symptomatic relief and is withholding polyethylene glycol and docusate until bowel movements normalize. - Advised loperamide for loose, watery stools, with instructions to take one dose if diarrhea recurs. - Recommended withholding polyethylene glycol and docusate until bowel movements return to normal consistency. - Provided dietary guidance to limit fiber and focus  on soft, low-residue foods. - Discussed resuming polyethylene glycol and docusate only when bowel movements normalize.  Elevated blood pressure and heart rate Consistently elevated blood pressure and heart rate, likely multifactorial due to pain,  anxiety, and effects of malignancy and chemotherapy. No prior hypertension. No antihypertensive therapy initiated pending home monitoring. She sometimes feels anxious but denies sleep disturbance. - Advised home blood pressure monitoring with an upper arm cuff for accuracy. - Deferred initiation of antihypertensive medication pending home readings. - Discussed that pain and anxiety may contribute to elevated blood pressure and heart rate. - Recommended hydration and dietary monitoring. - Scheduled follow-up to reassess blood pressure and heart rate.  Plan - I reviewed her recent ED visit, especially lab and scan images.  I discussed the findings with patient in detail - Plan to proceed to cycle 2-day 1 chemotherapy tomorrow, and day 8 treatment next week - We discussed symptom management, I recommended residual diet, due to the risk of bowel obstruction -she is scheduled to see Dr. Debora at [facility] this Friday - See her next week before chemo. -referral to [facility] for symptom management   Addendum - I got a call from radiology about the addendum of her recent CT scan from ED visit, which unfortunately showed new peritoneal metastasis.  I will review that with the patient tomorrow when she comes in for treatment. - Her NGS [facility] showed HER2 FISH (+), no is a targetable mutations.  I will review with the patient tomorrow.   SUMMARY OF ONCOLOGIC HISTORY: Oncology History  Gallbladder cancer (HCC)  04/08/2024 Cancer Staging   Staging form: Gallbladder, AJCC 8th Edition - Pathologic stage from 04/08/2024: Stage IVB (pT2, pN0, pM1) - Signed by Lanny Callander, MD on 04/15/2024 Total positive nodes: 0 Histologic grade (G): G2 Histologic grading system: 3 grade  system Residual tumor (R): RX   04/15/2024 Initial Diagnosis   Gallbladder cancer (HCC)   04/20/2024 PET scan   IMPRESSION: 1. Hypermetabolic gallbladder wall thickening post partial cholecystectomy, extending from the body of the gallbladder to the gallbladder neck. 2. Small Focal metabolic activity in the adjacent liver margin most suggestive of local extension into the liver parenchyma by gallbladder carcinoma. 3. No additional hypermetabolic foci within the liver. 4. No hypermetabolic periportal lymph nodes or other hypermetabolic metastatic disease.     05/04/2024 -  Chemotherapy   Patient is on Treatment Plan : BILIARY TRACT Cisplatin  + Gemcitabine  D1,8 + Durvalumab  (1500) D1 q21d / Durvalumab  (1500) q28d        Discussed the use of AI scribe software for clinical note transcription with the patient, who gave verbal consent to proceed.  History of Present Illness [name] is a 49 year old female with metastatic gallbladder cancer who presents for follow-up after an emergency department visit for severe abdominal pain and acute diarrhea.  On January 25, she developed severe right-sided abdominal pain radiating to her back that did not respond to tramadol  or acetaminophen  and prevented sleep, leading to an ED visit. In the ED she received IV fluids and abdominal imaging, and was told there was no bowel obstruction or perforation. She had no bloating, distension, or bowel changes before this episode.  After contrast and a likely laxative in the ED, she developed severe watery diarrhea that persisted into the next day. Three doses of loperamide improved symptoms, and she has had one loose stool today. She has held polyethylene glycol and docusate until her bowel pattern normalizes and has not had constipation since the diarrhea.  Her current pain regimen is oxycodone  (started during a recent hospitalization), acetaminophen , and tramadol . She alternates these based on  pain severity and notes tramadol  gave minimal relief during her worst pain. She is  concerned about achieving adequate pain control without overuse. She also takes ondansetron  as needed for nausea and loratadine as needed.  She has noticed persistently elevated blood pressure and heart rate during chemotherapy and at home, without prior hypertension or current antihypertensives. She is worried about stroke risk and is considering home blood pressure monitoring. She sometimes feels anxiety related to her health and pain but denies sleep disturbance.  She developed a localized rash at the new port site, which she attributes to irritation. She is using topical hydrocortisone. 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 She is interested in palliative care for pain and symptom management and asks about adjunctive options such as grounding sheets.     All other systems were reviewed with the patient and are negative.  MEDICAL HISTORY:  Past Medical History:  Diagnosis Date   Allergy    Cancer [facility])     SURGICAL HISTORY: Past Surgical History:  Procedure Laterality Date   CESAREAN SECTION     CHOLECYSTECTOMY N/A 04/08/2024   Procedure: LAPAROSCOPIC CHOLECYSTECTOMY;  Surgeon: Dasie Leonor CROME, MD;  Location: [facility] OR;  Service: General;  Laterality: N/A;   IR IMAGING GUIDED PORT INSERTION  04/27/2024   MOLE REMOVAL     OPEN PARTIAL HEPATECTOMY  N/A 04/08/2024   Procedure: HEPATECTOMY, PARTIAL, OPEN;  Surgeon: Dasie Leonor CROME, MD;  Location: [facility] OR;  Service: General;  Laterality: N/A;    I have reviewed the social history and family history with the patient and they are unchanged from previous note.  ALLERGIES:  is allergic to shellfish allergy.  MEDICATIONS:  Current Outpatient Medications  Medication Sig Dispense Refill   acetaminophen  (TYLENOL ) 500 MG tablet Take 500-1,000 mg by mouth every 6 (six) hours as needed (pain.).      docusate sodium  (COLACE) 100 MG capsule Take 100 mg by mouth daily as needed for mild constipation.     folic acid (FOLVITE) 1 MG tablet Take 1 mg by mouth at bedtime.  3   lidocaine -prilocaine  (EMLA ) cream Apply to affected area once 30 g 3   loratadine (CLARITIN) 10 MG tablet Take 10 mg by mouth daily.     Melatonin 10 MG CAPS Take 10 mg by mouth at bedtime as needed (Sleep).     Multiple Vitamin (MULTIVITAMIN WITH MINERALS) TABS tablet Take 1 tablet by mouth in the morning.     ondansetron  (ZOFRAN ) 8 MG tablet Take 1 tablet (8 mg total) by mouth every 8 (eight) hours as needed for nausea or vomiting. Start on the third day after cisplatin . 30 tablet 1   oxyCODONE  (OXY IR/ROXICODONE ) 5 MG immediate release tablet Take 1-2 tablets (5-10 mg total) by mouth every 4 (four) hours as needed for moderate pain (pain score 4-6) or breakthrough pain. 30 tablet 0   prochlorperazine  (COMPAZINE ) 10 MG tablet Take 1 tablet (10 mg total) by mouth every 6 (six) hours as needed (Nausea or vomiting). 30 tablet 1   No current facility-administered medications for this visit.    PHYSICAL EXAMINATION: ECOG PERFORMANCE STATUS: 2 - Symptomatic, <50% confined to bed  Vitals:   05/24/24 1500 05/24/24 1501  BP: (!) 124/121 (!) 128/92  Pulse: (!) 124 (!) 108  Resp: 18 16  Temp:    SpO2: 100% 100%   Wt Readings from Last 3 Encounters:  05/24/24 121 lb 6.4 oz (55.1 kg)  05/22/24 123 lb 7.3 oz (56 kg)  05/11/24 123 lb 12.8 oz (  56.2 kg)     GENERAL:alert, no distress and comfortable SKIN: skin color, texture, turgor are normal, no rashes or significant lesions EYES: normal, Conjunctiva are pink and non-injected, sclera clear NECK: supple, thyroid  normal size, non-tender, without nodularity LYMPH:  no palpable lymphadenopathy in the cervical, axillary  LUNGS: clear to auscultation and percussion with normal breathing effort HEART: regular rate & rhythm and no murmurs and no lower extremity  edema ABDOMEN:abdomen soft, non-tender and normal bowel sounds Musculoskeletal:no cyanosis of digits and no clubbing  NEURO: alert & oriented x 3 with fluent speech, no focal motor/sensory deficits  Physical Exam SKIN: Rash present at port site.  LABORATORY DATA:  I have reviewed the data as listed    Latest Ref Rng & Units 05/24/2024    2:20 PM 05/23/2024    4:11 AM 05/22/2024   11:09 PM  CBC  WBC 4.0 - 10.5 K/uL 5.1  5.7  5.9   Hemoglobin 12.0 - 15.0 g/dL 87.9  88.2  87.7   Hematocrit 36.0 - 46.0 % 34.6  36.5  35.8   Platelets 150 - 400 K/uL 335  245  238         Latest Ref Rng & Units 05/24/2024    2:20 PM 05/23/2024    4:11 AM 05/22/2024   11:09 PM  CMP  Glucose 70 - 99 mg/dL 893  96  866   BUN 6 - 20 mg/dL 14  9  10    Creatinine 0.44 - 1.00 mg/dL 9.24  9.22  9.20   Sodium 135 - 145 mmol/L 137  139  137   Potassium 3.5 - 5.1 mmol/L 3.9  4.5  3.7   Chloride 98 - 111 mmol/L 97  101  100   CO2 22 - 32 mmol/L 27  28  24    Calcium 8.9 - 10.3 mg/dL 9.3  9.8  9.7   Total Protein 6.5 - 8.1 g/dL 8.0   7.9   Total Bilirubin 0.0 - 1.2 mg/dL 0.4   0.3   Alkaline Phos 38 - 126 U/L 133   110   AST 15 - 41 U/L 49   27   ALT 0 - 44 U/L 59   32       RADIOGRAPHIC STUDIES: I have personally reviewed the radiological images as listed and agreed with the findings in the report. 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 Findings also noted along the right abdomen but less conspicuous (2:40) along the tumor . Second foci of hepatic infiltration versus hepatic origin noted along the inferior right hepatic lobe anteriorly (2.33), as well as previously identified 2.1 cm irregular hepatic hypodensity (2.27). ---------------------------------------------------- Electronically signed by: Kate Plummer MD 05/24/2024 06:06 PM EST RP Workstation: [id number]   Result Date: 05/24/2024  ORIGINAL REPORT  EXAM: CT ABDOMEN AND PELVIS WITH CONTRAST 05/23/2024 12:32:49 AM TECHNIQUE: CT of the abdomen and pelvis was performed with the administration of 100 mL of iohexol  (OMNIPAQUE ) 300 MG/ML solution. Multiplanar reformatted images are provided for review. Automated exposure control, iterative reconstruction, and/or weight-based adjustment of the mA/kV was utilized to reduce the radiation dose to as low as reasonably achievable. COMPARISON: PET CT 04/20/2024, CT abdomen and pelvis 03/29/2024 CLINICAL HISTORY: Abdominal pain, acute, nonlocalized. FINDINGS: LOWER CHEST: No acute abnormality. LIVER: Pericholecystic irregular hypodensity of  the hepatic lobe measuring up to 2.1 cm (2.27). Interval development of intrahepatic biliary ductal dilatation measuring up to 6 mm. GALLBLADDER AND BILE DUCTS: Complex-appearing centrally hypodense/necrotic irregular right upper quadrant mass measuring 5 x 4.3 cm with peripheral enhancement. Finding in the expected region of the gallbladder. The finding abuts the transverse colon (2:43; 7:38). Interval development of intrahepatic biliary ductal dilatation measuring up to 6 mm. SPLEEN: No acute abnormality. PANCREAS: No acute abnormality. ADRENAL GLANDS: No acute abnormality. KIDNEYS, URETERS AND BLADDER: No stones in the kidneys or ureters. No hydronephrosis. No perinephric or periureteral stranding. Urinary bladder is unremarkable. GI AND BOWEL: Approximately 8 cm segment of mid transverse colon adjacent to this mass appears with irregular walls and decreased caliber/completely collapsed lumen limiting its evaluation and concerning for possible infiltration/stricture at this level. Stool throughout the cecum. No small bowel thickening or dilatation. The appendix is not definitely identified with no inflammatory changes in the right lower quadrant to suggest acute appendicitis. No  pneumatosis. Stomach demonstrates no acute abnormality. There is no bowel obstruction. PERITONEUM AND RETROPERITONEUM: No ascites. No free air. VASCULATURE: Aorta is normal in caliber. LYMPH NODES: No lymphadenopathy. REPRODUCTIVE ORGANS: The uterus is unremarkable. No adnexal mass. BONES AND SOFT TISSUES: No acute osseous abnormality. No focal soft tissue abnormality. IMPRESSION: 1. Approximately 8 cm segment of mid transverse colon adjacent to the known gallbladder fossa mass with irregular walls and decreased caliber/completely collapsed lumen, concerning for possible infiltration/stricture. The cecum is approximately filled with stool, with likely developing obstruction. 2. Interval development of intrahepatic biliary ductal dilatation measuring up to 6 mm. 3. Complex-appearing centrally hypodense/necrotic irregular right upper quadrant mass measuring 5 x 4.3 cm with peripheral enhancement in the expected region of the gallbladder, abutting the transverse colon, with adjacent pericholecystic irregular hepatic hypodensity measuring up to 2.1 cm. Findings consistent with malignancy with extension into liver. Electronically signed by: Morgane Naveau MD 05/23/2024 12:55 AM EST RP Workstation: [id number]   DG Abd Portable 1V Result Date: 05/23/2024 CLINICAL DATA:  Small-bowel obstruction EXAM: PORTABLE ABDOMEN - 1 VIEW COMPARISON:  Earlier same day CT abdomen and pelvis FINDINGS: Radiopaque enteric contrast material is seen throughout the colon to the level of the rectum. Dilute contrast material within the urinary bladder. Nonobstructive bowel gas pattern. No free air or pneumatosis. No acute or substantial osseous abnormality. The sacrum and coccyx are partially obscured by overlying bowel contents. Partially imaged lung bases are clear. IMPRESSION: Nonobstructive bowel gas pattern. Radiopaque enteric contrast material is seen throughout the colon to the level of the rectum. Electronically Signed   By: Limin  Xu  M.D.   On: 05/23/2024 10:38      Orders Placed This Encounter  Procedures   Amb Referral to Palliative Care    Referral Priority:   Routine    Referral Type:   Consultation    Number of Visits Requested:   1   All questions were answered. The patient knows to call the clinic with any problems, questions or concerns. No barriers to learning was detected. The total time spent in the appointment was 60 minutes, including review of chart and various tests results, discussions about plan of care and coordination of care plan     Onita Mattock, MD 05/24/2024     "

## 2024-05-24 NOTE — Assessment & Plan Note (Signed)
 Stage IV with liver metastasis  -Diagnosed in Dec 2025.  - She presented with a right upper quadrant abdominal pain, CT scan showed an inflamed gallbladder, and a 1.2 cm hepatic lesion which was not seen on the subsequent MRI. -She underwent laparoscopic a partial cholecystectomy and liver biopsy on April 08, 2024 with postop drain placement.  Surgical path showed adenocarcinoma with liver met.  -she started chemo cisplatin , gemcitabine  and durvalumab  on 05/05/2023

## 2024-05-25 ENCOUNTER — Encounter: Payer: Self-pay | Admitting: General Practice

## 2024-05-25 ENCOUNTER — Telehealth (HOSPITAL_COMMUNITY): Payer: Self-pay | Admitting: Emergency Medicine

## 2024-05-25 ENCOUNTER — Inpatient Hospital Stay

## 2024-05-25 ENCOUNTER — Other Ambulatory Visit: Payer: Self-pay

## 2024-05-25 VITALS — BP 125/82 | HR 98 | Resp 16

## 2024-05-25 DIAGNOSIS — Z5112 Encounter for antineoplastic immunotherapy: Secondary | ICD-10-CM | POA: Diagnosis not present

## 2024-05-25 DIAGNOSIS — C23 Malignant neoplasm of gallbladder: Secondary | ICD-10-CM

## 2024-05-25 MED ORDER — SODIUM CHLORIDE 0.9 % IV SOLN: INTRAVENOUS | Status: DC

## 2024-05-25 MED ORDER — MAGNESIUM SULFATE 2 GM/50ML IV SOLN
2.0000 g | Freq: Once | INTRAVENOUS | Status: AC
  Administered 2024-05-25: 2 g via INTRAVENOUS
  Filled 2024-05-25: qty 50

## 2024-05-25 MED ORDER — SODIUM CHLORIDE 0.9 % IV SOLN
1000.0000 mg/m2 | Freq: Once | INTRAVENOUS | Status: AC
  Administered 2024-05-25: 1672 mg via INTRAVENOUS
  Filled 2024-05-25: qty 43.97

## 2024-05-25 MED ORDER — PALONOSETRON HCL INJECTION 0.25 MG/5ML
0.2500 mg | Freq: Once | INTRAVENOUS | Status: AC
  Administered 2024-05-25: 0.25 mg via INTRAVENOUS
  Filled 2024-05-25: qty 5

## 2024-05-25 MED ORDER — DEXAMETHASONE SOD PHOSPHATE PF 10 MG/ML IJ SOLN
10.0000 mg | Freq: Once | INTRAMUSCULAR | Status: AC
  Administered 2024-05-25: 10 mg via INTRAVENOUS
  Filled 2024-05-25: qty 1

## 2024-05-25 MED ORDER — SODIUM CHLORIDE 0.9 % IV SOLN
150.0000 mg | Freq: Once | INTRAVENOUS | Status: AC
  Administered 2024-05-25: 150 mg via INTRAVENOUS
  Filled 2024-05-25: qty 150

## 2024-05-25 MED ORDER — POTASSIUM CHLORIDE IN NACL 20-0.9 MEQ/L-% IV SOLN
Freq: Once | INTRAVENOUS | Status: AC
  Filled 2024-05-25: qty 1000

## 2024-05-25 MED ORDER — SODIUM CHLORIDE 0.9 % IV SOLN
1500.0000 mg | Freq: Once | INTRAVENOUS | Status: AC
  Administered 2024-05-25: 1500 mg via INTRAVENOUS
  Filled 2024-05-25: qty 30

## 2024-05-25 MED ORDER — SODIUM CHLORIDE 0.9 % IV SOLN
25.0000 mg/m2 | Freq: Once | INTRAVENOUS | Status: AC
  Administered 2024-05-25: 42 mg via INTRAVENOUS
  Filled 2024-05-25: qty 42

## 2024-05-25 NOTE — Progress Notes (Signed)
 [facility] Spiritual Care Note  Followed up with [name] in infusion. She was busy advocating for herself regarding a pre-auth need, and she reports that she is eager for Friday's surgical consultation so that she can learn about her plan of care moving forward. She also shared that she and her husband have been busy with important logistical tasks, such as preparing a will and discussing Advance Directives and plans for disposition of their bodies after death. Offered chaplain support as conversation partner about the healthcare-related subjects. We plan to follow up at her treatment next week to provide an opportunity to debrief and process the surgical consult.  [address] [name], [place], [facility] Pager [phone] Voicemail [phone]

## 2024-05-25 NOTE — Telephone Encounter (Signed)
 On my shift in the ED today, received a call from [facility] radiology about a STAT addendum read on a CT abd pelvis that was performed in the ED on 05/22/24.   Original CT abd pelvis read showed: Approximately 8 cm segment of mid transverse colon adjacent to known gallbladder fossa mass with irregular walls and decreased caliber, completely collapsed lumen, concerning for possible infiltration/stricture.  The cecum is approximately filled with stool, likely developing obstruction.  Complex appearing centrally hypodense necrotic irregular right upper quadrant mass measuring 5 x 4.3 cm with peripheral enhancement in the expected region of the gallbladder with adjacent pericholecystic irregular hepatic hypodensity measuring up to 2.1 cm.  Finding consistent with malignancy with extension into liver.   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 Findings also noted along the right abdomen but less conspicuous (2:40) along the tumor .   Second foci of hepatic infiltration versus hepatic origin noted along the inferior right hepatic lobe anteriorly (2.33), as well as previously identified 2.1 cm irregular hepatic hypodensity (2.27). ---------------------------------------------------- Electronically signed by: Morgane Naveau MD 05/24/2024 06:06 PM EST RP Workstation: [id number]   I called patient's oncologist Dr. Lanny this morning who stated she was already aware of this read as well. She is following the patient outpatient.

## 2024-05-25 NOTE — Patient Instructions (Signed)
 [facility] - A DEPT OF [facility]  Discharge Instructions: Thank you for choosing  Cancer Center to provide your oncology and hematology care.   If you have a lab appointment with the Cancer Center, please go directly to the Cancer Center and check in at the registration area.   Wear comfortable clothing and clothing appropriate for easy access to any Portacath or PICC line.   We strive to give you quality time with your provider. You may need to reschedule your appointment if you arrive late (15 or more minutes).  Arriving late affects you and other patients whose appointments are after yours.  Also, if you miss three or more appointments without notifying the office, you may be dismissed from the clinic at the provider's discretion.      For prescription refill requests, have your pharmacy contact our office and allow 72 hours for refills to be completed.    Today you received the following chemotherapy and/or immunotherapy agents: Durvalumab (Imfinzi), Gemcitabine (Gemzar), & Cisplatin (Platinol)    To help prevent nausea and vomiting after your treatment, we encourage you to take your nausea medication as directed.  BELOW ARE SYMPTOMS THAT SHOULD BE REPORTED IMMEDIATELY: *FEVER GREATER THAN 100.4 F (38 C) OR HIGHER *CHILLS OR SWEATING *NAUSEA AND VOMITING THAT IS NOT CONTROLLED WITH YOUR NAUSEA MEDICATION *UNUSUAL SHORTNESS OF BREATH *UNUSUAL BRUISING OR BLEEDING *URINARY PROBLEMS (pain or burning when urinating, or frequent urination) *BOWEL PROBLEMS (unusual diarrhea, constipation, pain near the anus) TENDERNESS IN MOUTH AND THROAT WITH OR WITHOUT PRESENCE OF ULCERS (sore throat, sores in mouth, or a toothache) UNUSUAL RASH, SWELLING OR PAIN  UNUSUAL VAGINAL DISCHARGE OR ITCHING   Items with * indicate a potential emergency and should be followed up as soon as possible or go to the Emergency Department if any problems should  occur.  Please show the CHEMOTHERAPY ALERT CARD or IMMUNOTHERAPY ALERT CARD at check-in to the Emergency Department and triage nurse.  Should you have questions after your visit or need to cancel or reschedule your appointment, please contact [facility] - A DEPT OF [facility]  Dept: [phone]  and follow the prompts.  Office hours are 8:00 a.m. to 4:30 p.m. Monday - Friday. Please note that voicemails left after 4:00 p.m. may not be returned until the following business day.  We are closed weekends and major holidays. You have access to a nurse at all times for urgent questions. Please call the main number to the clinic Dept: [phone] and follow the prompts.   For any non-urgent questions, you may also contact your provider using MyChart. We now offer e-Visits for anyone 59 and older to request care online for non-urgent symptoms. For details visit [email or web address].   Also download the MyChart app! Go to the app store, search MyChart, open the app, select , and log in with your MyChart username and password.

## 2024-05-26 ENCOUNTER — Encounter: Payer: Self-pay | Admitting: Hematology

## 2024-05-27 ENCOUNTER — Encounter: Payer: Self-pay | Admitting: Hematology

## 2024-05-27 NOTE — Addendum Note (Signed)
 Addended by: LANNY CALLANDER on: 05/27/2024 01:30 PM   Modules accepted: Orders

## 2024-05-31 ENCOUNTER — Telehealth: Payer: Self-pay

## 2024-05-31 ENCOUNTER — Telehealth: Payer: Self-pay | Admitting: Nurse Practitioner

## 2024-05-31 ENCOUNTER — Encounter: Payer: Self-pay | Admitting: Hematology

## 2024-05-31 MED FILL — Fosaprepitant Dimeglumine For IV Infusion 150 MG (Base Eq): INTRAVENOUS | Qty: 5 | Status: AC

## 2024-05-31 NOTE — Telephone Encounter (Signed)
 Called and spoke to patient about abdominal and back pain. She reports pain at 8/10 in severity. She states that yesterday was a very good day. She was able to work some and she ate well, more than she had in some time. She states that at 12:11 am she woke up with this pain. She took 2 advil (400 mg). The pain did not get much better, she took 500 mg tylenol  at 6:50 am and 11 am. When pain persisted, she took oxycodone  at 1:30 and 3 pm and additional 500 mg tylenol  at 4 pm. She also took Gas-X at 4:30 pm. She states that she is not constipated, as she has had 2 normal bowel movements today. She has been using heating pad and tried positional changes with not much relief. She stated that she did not want to go to the ED if at all possible. Advised her she could take 600 mg Advil and additional tablet of Gas-X. If pain was persistent, she should consider trying the tramadol  that she has. If there is no relief, I do think she should consider a trip to the ED to evaluate for causes of the abdominal pain. She voiced understanding. She does have appointment tomorrow for labs, follow up, and chemotherapy. She stated that if possible, she would wait to be seen at her appointment.  -Powell Lessen, NP

## 2024-05-31 NOTE — Telephone Encounter (Signed)
 Pt called stating that she had spoke with Dr Lanny regarding pain she's been having.  Pt stated the pain is getting worse and would like something to better manage her pain.  Pt requested if Dr Lanny or someone from her Team would contact her to further discuss pain management.

## 2024-06-01 ENCOUNTER — Telehealth: Payer: Self-pay | Admitting: *Deleted

## 2024-06-01 ENCOUNTER — Inpatient Hospital Stay

## 2024-06-01 ENCOUNTER — Encounter: Payer: Self-pay | Admitting: Hematology

## 2024-06-01 ENCOUNTER — Inpatient Hospital Stay: Attending: Hematology | Admitting: Hematology

## 2024-06-01 ENCOUNTER — Inpatient Hospital Stay: Admitting: Dietician

## 2024-06-01 ENCOUNTER — Inpatient Hospital Stay: Admitting: Hematology

## 2024-06-01 VITALS — BP 107/96 | HR 117 | Temp 98.4°F | Resp 16 | Ht 69.0 in | Wt 122.0 lb

## 2024-06-01 DIAGNOSIS — C23 Malignant neoplasm of gallbladder: Secondary | ICD-10-CM

## 2024-06-01 LAB — CBC WITH DIFFERENTIAL (CANCER CENTER ONLY)
Abs Immature Granulocytes: 0.01 10*3/uL (ref 0.00–0.07)
Basophils Absolute: 0 10*3/uL (ref 0.0–0.1)
Basophils Relative: 1 %
Eosinophils Absolute: 0 10*3/uL (ref 0.0–0.5)
Eosinophils Relative: 0 %
HCT: 33 % — ABNORMAL LOW (ref 36.0–46.0)
Hemoglobin: 11.6 g/dL — ABNORMAL LOW (ref 12.0–15.0)
Immature Granulocytes: 0 %
Lymphocytes Relative: 42 %
Lymphs Abs: 1.9 10*3/uL (ref 0.7–4.0)
MCH: 30.7 pg (ref 26.0–34.0)
MCHC: 35.2 g/dL (ref 30.0–36.0)
MCV: 87.3 fL (ref 80.0–100.0)
Monocytes Absolute: 0.3 10*3/uL (ref 0.1–1.0)
Monocytes Relative: 7 %
Neutro Abs: 2.2 10*3/uL (ref 1.7–7.7)
Neutrophils Relative %: 50 %
Platelet Count: 398 10*3/uL (ref 150–400)
RBC: 3.78 MIL/uL — ABNORMAL LOW (ref 3.87–5.11)
RDW: 13.7 % (ref 11.5–15.5)
WBC Count: 4.4 10*3/uL (ref 4.0–10.5)
nRBC: 0 % (ref 0.0–0.2)

## 2024-06-01 LAB — CMP (CANCER CENTER ONLY)
ALT: 38 U/L (ref 0–44)
AST: 33 U/L (ref 15–41)
Albumin: 4.3 g/dL (ref 3.5–5.0)
Alkaline Phosphatase: 111 U/L (ref 38–126)
Anion gap: 13 (ref 5–15)
BUN: 10 mg/dL (ref 6–20)
CO2: 26 mmol/L (ref 22–32)
Calcium: 9.6 mg/dL (ref 8.9–10.3)
Chloride: 97 mmol/L — ABNORMAL LOW (ref 98–111)
Creatinine: 0.69 mg/dL (ref 0.44–1.00)
GFR, Estimated: >60 mL/min
Glucose, Bld: 152 mg/dL — ABNORMAL HIGH (ref 70–99)
Potassium: 3.6 mmol/L (ref 3.5–5.1)
Sodium: 136 mmol/L (ref 135–145)
Total Bilirubin: 0.4 mg/dL (ref 0.0–1.2)
Total Protein: 8 g/dL (ref 6.5–8.1)

## 2024-06-01 LAB — MAGNESIUM: Magnesium: 1.8 mg/dL (ref 1.7–2.4)

## 2024-06-01 NOTE — Telephone Encounter (Signed)
 Per Dr. Lanny, okay to administer post-hydration IVF with cisplatin  on 06/04/2024.

## 2024-06-01 NOTE — Progress Notes (Signed)
 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  - She presented with a right upper quadrant abdominal pain, CT scan showed an inflamed gallbladder, and a 1.2 cm hepatic lesion which was not seen on the subsequent MRI. -She underwent laparoscopic a partial cholecystectomy and liver biopsy on April 08, 2024 with postop drain placement.  Surgical path showed adenocarcinoma with liver met.  -she started chemo cisplatin , gemcitabine  and durvalumab  on 05/05/2023 -CT 05/23/2024 unfortunately showed new peritoneal metastasis  -plan to add Phesgo to her chemo   Assessment & Plan Metastatic gallbladder cancer with peritoneal metastases Prognosis remains poor due to aggressive disease. HER2 antibody therapy is being pursued pending insurance approval. Surgical intervention is deferred until disease control. She has normal organ function and mild chemotherapy-induced anemia. Clinical trial options are under consideration with surgical oncology and external centers. Ongoing monitoring for pain, nutritional status, and cardiac function is required. - Pursued insurance approval for HER2 antibody therapy; peer-to-peer appeal scheduled. If approved, add HER2 antibody to regimen and monitor for cardiotoxicity with baseline and q3 month echocardiograms. - If HER2 antibody is not approved, discuss alternative regimens, consider out-of-pocket payment, or switch to FOLFOX if progression or lack of response. - Repeat CT imaging every 2-3 months to assess response; obtained blood work  prior to each chemotherapy cycle; monitored for pain, gastrointestinal symptoms, blood pressure, heart rate, hepatic/renal function, and anemia. - Discussed clinical trial options; contacted collaborating centers (including [facility]) for availability, especially if progression or HER2 antibody not approved. - Monitored nutritional status; advised against fasting due to malnutrition risk; provided dietary guidance to prevent bowel obstruction/perforation. - Encouraged probiotics or yogurt for gut health; fecal transplant not recommended. - Referred to palliative care for symptom management and quality of life.  Neoplasm-related pain Pain secondary to tumor burden, with fluctuating right-sided and back pain and episodes of severe pain. Managed with acetaminophen , NSAIDs, tramadol , and oxycodone , with variable efficacy. Constipation is a concern due to opioid use. Palliative care referral is part of symptom management. - Continued alternating acetaminophen , NSAIDs, tramadol , and oxycodone  as needed, ensuring tramadol  and oxycodone  are spaced by at least 2-3 hours. - Increased oxycodone  to 2-3 tablets for severe pain as needed. - Increased tramadol  to 2 tablets for severe pain as needed, spaced from oxycodone . - Managed opioid-induced constipation with Miralax , dietary fiber, hydration, and coffee as tolerated. - Referred to palliative care for pain management; appointment scheduled for February 24. - Adjusted regimen as needed to maintain pain below 5/10.  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   Tachycardia Episodes of tachycardia, primarily in clinic, with improvement at home. Baseline echocardiogram required prior to HER2 antibody therapy due to cardiotoxicity risk (up to 20%). - Ordered baseline echocardiogram prior to HER2 antibody therapy initiation. - Planned repeat echocardiogram every 3 months during HER2 antibody therapy if initiated.  Plan - Due Tuesday  pending insurance approval for phesgo, we canceled her infusion today, P2P scheduled for this Friday - Pain management reviewed with patient in detail, she will continue oxycodone , tramadol , Tylenol  and ibuprofen as needed -  Follow up in a few weeks, depends on her chemo schedule.   SUMMARY OF ONCOLOGIC HISTORY: Oncology History  Gallbladder cancer (HCC)  04/08/2024 Cancer Staging   Staging form: Gallbladder, AJCC 8th Edition - Pathologic stage from 04/08/2024: Stage IVB (pT2, pN0, pM1) - Signed by Lanny Callander, MD on 04/15/2024 Total positive nodes: 0 Histologic grade (G): G2 Histologic grading system: 3 grade system Residual tumor (R): RX   04/15/2024 Initial Diagnosis   Gallbladder cancer (HCC)   04/20/2024 PET scan   IMPRESSION: 1. Hypermetabolic gallbladder wall thickening post partial cholecystectomy, extending from the body of the gallbladder to the gallbladder neck. 2. Small Focal metabolic activity in the adjacent liver margin most suggestive of local extension into the liver parenchyma by gallbladder carcinoma. 3. No additional hypermetabolic foci within the liver. 4. No hypermetabolic periportal lymph nodes or other hypermetabolic metastatic disease.     05/04/2024 -  Chemotherapy   Patient is on Treatment Plan : BILIARY TRACT Cisplatin  + Gemcitabine  D1,8 + Durvalumab  (1500) D1 q21d / Durvalumab  (1500) q28d        Discussed the use of AI scribe software for clinical note transcription with the patient, who gave verbal consent to proceed.  History of Present Illness [name] is a 49 year old female with metastatic gallbladder adenocarcinoma who presents for follow-up of disease status and management of neoplasm-related pain.  She has metastatic gallbladder adenocarcinoma with liver and peritoneal disease. Recent CT showed new peritoneal metastases not seen on prior PET. She has completed at least one cycle plus three additional treatments of cisplatin ,  gemcitabine , and durvalumab .  She has significant right-sided abdominal pain radiating to the back, sometimes with a burning mid-abdominal quality and occasional association with hunger. Severity fluctuates and sometimes requires opioids. On 2/2 she needed only one oxycodone , but on 2/3 she had worse right-sided pain in the afternoon, worsened by sitting or standing. She alternates acetaminophen , ibuprofen, tramadol , and oxycodone  for pain, and does not take tramadol  and oxycodone  together. She recently finds tramadol  helpful.  She uses polyethylene glycol, coffee, and dietary fiber to prevent opioid-induced constipation and currently has soft stools without constipation. She has no nausea or vomiting and takes ondansetron  prophylactically with chemotherapy. She reports intermittent episodes of elevated heart rate at home but feels stable. Recent labs showed mild chemotherapy-related anemia.  She and her husband have considered probiotics, dietary changes, and fecal transplant capsules but have not started these, and she has not pursued fenbendazole. She is not fasting because of concern for weight loss and malnutrition. She is scheduled to see palliative care later this month for pain management. Her husband accompanies her and is closely involved in her care.  May 24, 2024: Follow-up visit for metastatic gallbladder cancer after recent emergency department visit for severe abdominal pain and acute diarrhea. Patient is on cisplatin , gemcitabine , and durvalumab  (started 05/04/2024); recent CT showed tumor infiltration into colon and new peritoneal metastasis. Pain management remains challenging, acute diarrhea has improved, and blood pressure/heart rate are elevated likely due to pain and anxiety; cleared for ongoing chemotherapy and referred to palliative care for symptom management.     All other systems were reviewed with the patient and are negative.  MEDICAL HISTORY:  Past Medical History:   Diagnosis Date   Allergy    Cancer [facility])     SURGICAL HISTORY: Past Surgical History:  Procedure Laterality Date   CESAREAN SECTION     CHOLECYSTECTOMY N/A 04/08/2024   Procedure: LAPAROSCOPIC CHOLECYSTECTOMY;  Surgeon: Dasie Best  L, MD;  Location: [facility] OR;  Service: General;  Laterality: N/A;   IR IMAGING GUIDED PORT INSERTION  04/27/2024   MOLE REMOVAL     OPEN PARTIAL HEPATECTOMY  N/A 04/08/2024   Procedure: HEPATECTOMY, PARTIAL, OPEN;  Surgeon: Dasie Leonor CROME, MD;  Location: [facility] OR;  Service: General;  Laterality: N/A;    I have reviewed the social history and family history with the patient and they are unchanged from previous note.  ALLERGIES:  is allergic to shellfish allergy.  MEDICATIONS:  Current Outpatient Medications  Medication Sig Dispense Refill   acetaminophen  (TYLENOL ) 500 MG tablet Take 500-1,000 mg by mouth every 6 (six) hours as needed (pain.).     docusate sodium  (COLACE) 100 MG capsule Take 100 mg by mouth daily as needed for mild constipation.     folic acid (FOLVITE) 1 MG tablet Take 1 mg by mouth at bedtime.  3   lidocaine -prilocaine  (EMLA ) cream Apply to affected area once 30 g 3   loratadine (CLARITIN) 10 MG tablet Take 10 mg by mouth daily.     Melatonin 10 MG CAPS Take 10 mg by mouth at bedtime as needed (Sleep).     Multiple Vitamin (MULTIVITAMIN WITH MINERALS) TABS tablet Take 1 tablet by mouth in the morning.     ondansetron  (ZOFRAN ) 8 MG tablet Take 1 tablet (8 mg total) by mouth every 8 (eight) hours as needed for nausea or vomiting. Start on the third day after cisplatin . 30 tablet 1   oxyCODONE  (OXY IR/ROXICODONE ) 5 MG immediate release tablet Take 1-2 tablets (5-10 mg total) by mouth every 4 (four) hours as needed for moderate pain (pain score 4-6) or breakthrough pain. 30 tablet 0   prochlorperazine  (COMPAZINE ) 10 MG tablet Take 1 tablet (10 mg total) by mouth every 6 (six) hours as needed (Nausea or vomiting). 30 tablet 1   No current  facility-administered medications for this visit.    PHYSICAL EXAMINATION: ECOG PERFORMANCE STATUS: 2 - Symptomatic, <50% confined to bed  Vitals:   06/01/24 0850  BP: (!) 107/96  Pulse: (!) 117  Resp: 16  Temp: 98.4 F (36.9 C)  SpO2: 100%   Wt Readings from Last 3 Encounters:  06/01/24 122 lb (55.3 kg)  05/24/24 121 lb 6.4 oz (55.1 kg)  05/22/24 123 lb 7.3 oz (56 kg)     GENERAL:alert, no distress and comfortable SKIN: skin color, texture, turgor are normal, no rashes or significant lesions EYES: normal, Conjunctiva are pink and non-injected, sclera clear Musculoskeletal:no cyanosis of digits and no clubbing  NEURO: alert & oriented x 3 with fluent speech, no focal motor/sensory deficits  Physical Exam    LABORATORY DATA:  I have reviewed the data as listed    Latest Ref Rng & Units 06/01/2024    7:51 AM 05/24/2024    2:20 PM 05/23/2024    4:11 AM  CBC  WBC 4.0 - 10.5 K/uL 4.4  5.1  5.7   Hemoglobin 12.0 - 15.0 g/dL 88.3  87.9  88.2   Hematocrit 36.0 - 46.0 % 33.0  34.6  36.5   Platelets 150 - 400 K/uL 398  335  245         Latest Ref Rng & Units 06/01/2024    7:51 AM 05/24/2024    2:20 PM 05/23/2024    4:11 AM  CMP  Glucose 70 - 99 mg/dL 847  893  96   BUN 6 - 20 mg/dL 10  14  9  Creatinine 0.44 - 1.00 mg/dL 9.30  9.24  9.22   Sodium 135 - 145 mmol/L 136  137  139   Potassium 3.5 - 5.1 mmol/L 3.6  3.9  4.5   Chloride 98 - 111 mmol/L 97  97  101   CO2 22 - 32 mmol/L 26  27  28    Calcium 8.9 - 10.3 mg/dL 9.6  9.3  9.8   Total Protein 6.5 - 8.1 g/dL 8.0  8.0    Total Bilirubin 0.0 - 1.2 mg/dL 0.4  0.4    Alkaline Phos 38 - 126 U/L 111  133    AST 15 - 41 U/L 33  49    ALT 0 - 44 U/L 38  59        RADIOGRAPHIC STUDIES: I have personally reviewed the radiological images as listed and agreed with the findings in the report. No results found.    Orders Placed This Encounter  Procedures   ECHOCARDIOGRAM COMPLETE    Standing Status:   Future     Expected Date:   06/02/2024    Expiration Date:   06/01/2025    Where should this test be performed:   [facility]    Perflutren DEFINITY (image enhancing agent) should be administered unless hypersensitivity or allergy exist:   Administer Perflutren    Reason for exam-Echo:   Chemo  Z09   All questions were answered. The patient knows to call the clinic with any problems, questions or concerns. No barriers to learning was detected. The total time spent in the appointment was 40 minutes, including review of chart and various tests results, discussions about plan of care and coordination of care plan     Onita Mattock, MD 06/01/2024     "

## 2024-06-02 ENCOUNTER — Other Ambulatory Visit (INDEPENDENT_AMBULATORY_CARE_PROVIDER_SITE_OTHER)

## 2024-06-02 ENCOUNTER — Ambulatory Visit (HOSPITAL_COMMUNITY)

## 2024-06-02 DIAGNOSIS — C23 Malignant neoplasm of gallbladder: Secondary | ICD-10-CM

## 2024-06-02 LAB — ECHOCARDIOGRAM COMPLETE
Area-P 1/2: 3.91 cm2
S' Lateral: 2.87 cm

## 2024-06-03 ENCOUNTER — Encounter: Payer: Self-pay | Admitting: Hematology

## 2024-06-03 ENCOUNTER — Other Ambulatory Visit: Payer: Self-pay | Admitting: Hematology

## 2024-06-03 DIAGNOSIS — C23 Malignant neoplasm of gallbladder: Secondary | ICD-10-CM

## 2024-06-04 ENCOUNTER — Inpatient Hospital Stay

## 2024-06-10 ENCOUNTER — Inpatient Hospital Stay

## 2024-06-14 ENCOUNTER — Inpatient Hospital Stay

## 2024-06-14 ENCOUNTER — Inpatient Hospital Stay: Admitting: Hematology

## 2024-06-15 ENCOUNTER — Inpatient Hospital Stay: Admitting: Dietician

## 2024-06-15 ENCOUNTER — Inpatient Hospital Stay

## 2024-06-21 ENCOUNTER — Inpatient Hospital Stay

## 2024-06-22 ENCOUNTER — Inpatient Hospital Stay

## 2024-07-06 ENCOUNTER — Inpatient Hospital Stay: Admitting: Hematology

## 2024-07-06 ENCOUNTER — Inpatient Hospital Stay

## 2024-07-07 ENCOUNTER — Inpatient Hospital Stay

## 2024-07-12 ENCOUNTER — Inpatient Hospital Stay: Attending: Hematology

## 2024-07-13 ENCOUNTER — Inpatient Hospital Stay
